# Patient Record
Sex: Male | Born: 1980 | Race: Black or African American | Hispanic: No | Marital: Single
Health system: Midwestern US, Community
[De-identification: ages and names within clinical notes are randomized; demographics above are authoritative.]

## PROBLEM LIST (undated history)

## (undated) DIAGNOSIS — E119 Type 2 diabetes mellitus without complications: Secondary | ICD-10-CM

## (undated) DIAGNOSIS — I10 Essential (primary) hypertension: Secondary | ICD-10-CM

## (undated) HISTORY — PX: TONSILLECTOMY: SUR1361

---

## 2016-03-07 ENCOUNTER — Inpatient Hospital Stay (HOSPITAL_COMMUNITY)
Admission: EM | Admit: 2016-03-07 | Discharge: 2016-03-10 | DRG: 501 | Disposition: A | Discharge status: Home Health | Payer: Worker's Compensation | Attending: Family Medicine | Admitting: Family Medicine

## 2016-03-07 ENCOUNTER — Emergency Department (HOSPITAL_COMMUNITY): Payer: Worker's Compensation

## 2016-03-07 ENCOUNTER — Encounter (HOSPITAL_COMMUNITY): Payer: Self-pay

## 2016-03-07 DIAGNOSIS — S76119A Strain of unspecified quadriceps muscle, fascia and tendon, initial encounter: Secondary | ICD-10-CM

## 2016-03-07 DIAGNOSIS — E119 Type 2 diabetes mellitus without complications: Secondary | ICD-10-CM

## 2016-03-07 DIAGNOSIS — M25569 Pain in unspecified knee: Secondary | ICD-10-CM | POA: Diagnosis present

## 2016-03-07 DIAGNOSIS — Y9269 Other specified industrial and construction area as the place of occurrence of the external cause: Secondary | ICD-10-CM

## 2016-03-07 DIAGNOSIS — W19XXXA Unspecified fall, initial encounter: Secondary | ICD-10-CM | POA: Diagnosis present

## 2016-03-07 DIAGNOSIS — Z79899 Other long term (current) drug therapy: Secondary | ICD-10-CM

## 2016-03-07 DIAGNOSIS — X509XXA Other and unspecified overexertion or strenuous movements or postures, initial encounter: Secondary | ICD-10-CM

## 2016-03-07 DIAGNOSIS — S76112A Strain of left quadriceps muscle, fascia and tendon, initial encounter: Secondary | ICD-10-CM

## 2016-03-07 DIAGNOSIS — S76111A Strain of right quadriceps muscle, fascia and tendon, initial encounter: Principal | ICD-10-CM | POA: Diagnosis present

## 2016-03-07 DIAGNOSIS — Z7984 Long term (current) use of oral hypoglycemic drugs: Secondary | ICD-10-CM

## 2016-03-07 DIAGNOSIS — M25561 Pain in right knee: Secondary | ICD-10-CM | POA: Diagnosis not present

## 2016-03-07 DIAGNOSIS — I10 Essential (primary) hypertension: Secondary | ICD-10-CM | POA: Diagnosis present

## 2016-03-07 DIAGNOSIS — Z791 Long term (current) use of non-steroidal anti-inflammatories (NSAID): Secondary | ICD-10-CM

## 2016-03-07 DIAGNOSIS — Z6841 Body Mass Index (BMI) 40.0 and over, adult: Secondary | ICD-10-CM

## 2016-03-07 DIAGNOSIS — Z23 Encounter for immunization: Secondary | ICD-10-CM

## 2016-03-07 DIAGNOSIS — S8991XA Unspecified injury of right lower leg, initial encounter: Secondary | ICD-10-CM | POA: Diagnosis present

## 2016-03-07 HISTORY — DX: Type 2 diabetes mellitus without complications: E11.9

## 2016-03-07 HISTORY — DX: Essential (primary) hypertension: I10

## 2016-03-07 NOTE — Progress Notes (Addendum)
EDCM discussed patient with Mila PalmerEDRN Mike.  Was disconnected. 2345pm Discussed patient with Dr. Wende MottMcKeag.

## 2016-03-07 NOTE — ED Triage Notes (Signed)
Pt reports he injured his left knee at work this past weekend. He reports that this morning he was trying to get up and use his crutches and his right knee gave out. He is able to flex the leg but not extend. Pt is unable to ambulate at this time.

## 2016-03-07 NOTE — ED Provider Notes (Signed)
[facility]PT Provider Note   CSN: [id number] Arrival date & time: 03/07/16  1814     History   Chief Complaint Chief Complaint  Patient presents with  . Knee Pain    HPI Basem Yannuzzi is a 35 y.o. male presenting with acute right knee injury. Patient states that this morning he was getting out of bed when he fell on his right knee and hurt up with immediate pain. He is not sure but he states that he may have twisted during this fall. Since this injury is now experiencing popping and locking in his right knee. He has significant pain over the inferior pole of the patella. He denies any new swelling or ecchymoses. He endorses some weakness but states that this may be due to the pain. He is unable to ambulate due to this injury as well as a recent injury with his left knee. No numbness. No recent antibiotic use.   Of note: he states that 2 weeks ago he sustained a LEFT sided knee injury. He claims that he was evaluated in [place] by an ED physician who diagnosed him with a patellar tendon rupture. He was advised to return home to [place] to have this further evaluated. He states that no MRI or ultrasound was used for this diagnosis but he has extension weakness with extension of his left knee which is what caused his eventual collapse and fall onto his right knee this morning. Patient has been using crutches since sustaining his left-sided knee injury 2 weeks ago.   HPI  Past Medical History:  Diagnosis Date  . Diabetes mellitus without complication (HCC)   . Hypertension     There are no active problems to display for this patient.   Past Surgical History:  Procedure Laterality Date  . TONSILLECTOMY         Home Medications    Prior to Admission medications   Medication Sig Start Date End Date Taking? Authorizing Provider  Apple Cid Vn-Grn Tea-Bit Or-Cr (APPLE CIDER VINEGAR PLUS) TABS Take 1 tablet by mouth 3 (three) times daily.   Yes Historical Provider, MD   lisinopril-hydrochlorothiazide (PRINZIDE,ZESTORETIC) 20-25 MG tablet Take 1 tablet by mouth daily.   Yes Historical Provider, MD  metFORMIN (GLUCOPHAGE) 1000 MG tablet Take 1,000 mg by mouth 2 (two) times daily with a meal.   Yes Historical Provider, MD  naproxen (NAPROSYN) 500 MG tablet Take 500 mg by mouth 2 (two) times daily with a meal.   Yes Historical Provider, MD  oxyCODONE-acetaminophen (PERCOCET/ROXICET) 5-325 MG tablet Take 1 tablet by mouth every 6 (six) hours as needed for severe pain.   Yes Historical Provider, MD    Family History History reviewed. No pertinent family history.  Social History Social History  Substance Use Topics  . Smoking status: Never Smoker  . Smokeless tobacco: Never Used  . Alcohol use No     Allergies   Review of patient's allergies indicates no known allergies.   Review of Systems Review of Systems  Constitutional: Negative for chills, diaphoresis, fatigue and fever.  HENT: Negative.   Eyes: Negative.   Respiratory: Negative.   Cardiovascular: Negative.   Gastrointestinal: Negative.   Musculoskeletal: Positive for arthralgias, gait problem and myalgias. Negative for back pain, joint swelling, neck pain and neck stiffness.  Skin: Negative for color change, rash and wound.  Neurological: Negative for dizziness, tremors, seizures, syncope, facial asymmetry, speech difficulty, light-headedness, numbness and headaches.  Hematological: Negative.   Psychiatric/Behavioral: Negative.  Physical Exam Updated Vital Signs BP 123/86   Pulse 102   Temp 97.5 F (36.4 C) (Oral)   Resp 18   Ht 6' (1.829 m)   Wt (!) 190.5 kg   SpO2 98%   BMI 56.96 kg/m   Physical Exam  Constitutional: He is oriented to person, place, and time. He appears well-developed and well-nourished. No distress.  HENT:  Head: Normocephalic and atraumatic.  Mouth/Throat: Oropharynx is clear and moist.  Eyes: Conjunctivae and EOM are normal. Pupils are equal, round,  and reactive to light.  Neck: Normal range of motion. Neck supple.  Cardiovascular: Normal rate, regular rhythm and normal heart sounds.   No murmur heard. Pulmonary/Chest: Effort normal and breath sounds normal. No respiratory distress.  Abdominal: Soft. Bowel sounds are normal. He exhibits no distension. There is no tenderness.  Musculoskeletal:       Right knee: He exhibits decreased range of motion. He exhibits no swelling, no ecchymosis, no deformity and no erythema. Tenderness found. Medial joint line and patellar tendon tenderness noted.       Left knee: He exhibits decreased range of motion and abnormal patellar mobility. He exhibits no swelling, no effusion, no ecchymosis, no erythema and normal alignment. Tenderness found. Patellar tendon tenderness noted. No medial joint line and no lateral joint line tenderness noted.       Legs: Unable to fully assess integrity of MCL/LCL/ACL/PCL secondary to pain. Patient unable to elevate LEFT leg in full extension.  Neurological: He is alert and oriented to person, place, and time. No cranial nerve deficit. He exhibits normal muscle tone.  Skin: Skin is warm and dry. Capillary refill takes less than 2 seconds. No rash noted. He is not diaphoretic.  Psychiatric: He has a normal mood and affect. His behavior is normal. Judgment and thought content normal.     ED Treatments / Results  Labs (all labs ordered are listed, but only abnormal results are displayed) Labs Reviewed  BASIC METABOLIC PANEL  CBC WITH DIFFERENTIAL/PLATELET    EKG  EKG Interpretation None       Radiology Dg Knee Complete 4 Views Right  Result Date: 03/07/2016 CLINICAL DATA:  Generalized right knee pain after fall while reaching for crutches. EXAM: RIGHT KNEE - COMPLETE 4+ VIEW COMPARISON:  None. FINDINGS: Slight joint space narrowing of the femorotibial compartment with minimal spurring of the tibial spine and tibial plateau. No intra-articular loose body,  fracture or bone destruction. No significant joint effusion. No gross malalignment. Soft tissues are grossly unremarkable. IMPRESSION: Mild femorotibial joint space narrowing and spurring without acute osseous abnormality. Electronically Signed   By: Tollie Eth M.D.   On: 03/07/2016 20:09    Procedures Procedures (including critical care time)  Medications Ordered in ED Medications  oxyCODONE-acetaminophen (PERCOCET/ROXICET) 5-325 MG per tablet 1 tablet (1 tablet Oral Given 03/08/16 0019)     Initial Impression / Assessment and Plan / ED Course  I have reviewed the triage vital signs and the nursing notes.  Pertinent labs & imaging results that were available during my care of the patient were reviewed by me and considered in my medical decision making (see chart for details).  Clinical Course   Right Knee Injury: Patient is here after sustaining a right knee injury. Etiology likely right meniscal tear. No evidence of effusion however patient endorses significant new popping and locking of the knee. Patient has significant tenderness over the medial joint line and inferior pole of the patella. Due to this acute  injury patient is unable to bear weight. Unfortunately patient also recently sustained a left knee injury which was diagnosed as a patellar tendon rupture, which he exhibits significant weakness in left knee extension. Patient does not have access to a wheelchair and is currently under Worker's Compensation for the initial injury weeks ago. Patient also reports that his home is not accessible even if a wheelchair was made available. Because of this he was deemed appropriate to have patient admitted for observation. Orthopedic surgery was consulted, Dr. Luiz BlareGraves agreed to see him in the morning.   Final Clinical Impressions(s) / ED Diagnoses   Final diagnoses:  Knee injury, right, initial encounter    New Prescriptions New Prescriptions   No medications on file     Kathee DeltonIan D  McKeag, MD 03/08/16 [id number]    Alvira MondayErin Schlossman, MD 03/12/16 1550

## 2016-03-07 NOTE — Progress Notes (Signed)
[facility].  Patient does not have insurance.  EDCM was also informed that patient is also under a worker's compensation case.  EDCM spoke to Dr. Wende MottMckeag.  Patient's case worker is responsible for coordinating patient's care. Any other interference could jeopardize patient's case.  EDCM discussed patient with EDRN Cricket, EDCM was transferred into patient's room.  Patient [place] is from [place]  He reports he has been in contact with his case worker.  He reports his case worker doesn't understand why the hospital in [place] discharged him and said he was medically cleared.  EDCM explained to patient that he needs to meet certain criteria to be admitted to the hospital.  [facility] explained to patient that without insurance it would be difficult to be placed in a facility or to arrange home health services.  Patient verbalized understanding.  EDCM informed patient will be happy to have the doctor place an order in the computer for a wheelchair, however patient is unsure if it will affect his case.  Patient reports his home is not wheelchair accessible.  EDCM spoke to patient's wife on the phone.  She is concerned because she is unable to take care of him at home due to his size.  She reports the case worker from patient's worker's compensation case is working on getting the patient an appointment with an orthopedic in the area.  EDCM also encouraged patient's wife to speak to patient's case worker regarding home health services.  EDCM offered support to patient and his wife.  No further EDCM needs at this time.

## 2016-03-08 ENCOUNTER — Encounter (HOSPITAL_COMMUNITY): Payer: Self-pay | Admitting: *Deleted

## 2016-03-08 ENCOUNTER — Other Ambulatory Visit: Payer: Self-pay | Admitting: Orthopedic Surgery

## 2016-03-08 ENCOUNTER — Observation Stay (HOSPITAL_COMMUNITY): Payer: Worker's Compensation

## 2016-03-08 DIAGNOSIS — Z791 Long term (current) use of non-steroidal anti-inflammatories (NSAID): Secondary | ICD-10-CM | POA: Diagnosis not present

## 2016-03-08 DIAGNOSIS — X509XXA Other and unspecified overexertion or strenuous movements or postures, initial encounter: Secondary | ICD-10-CM | POA: Diagnosis not present

## 2016-03-08 DIAGNOSIS — E119 Type 2 diabetes mellitus without complications: Secondary | ICD-10-CM

## 2016-03-08 DIAGNOSIS — S76112A Strain of left quadriceps muscle, fascia and tendon, initial encounter: Secondary | ICD-10-CM | POA: Diagnosis present

## 2016-03-08 DIAGNOSIS — Z7984 Long term (current) use of oral hypoglycemic drugs: Secondary | ICD-10-CM | POA: Diagnosis not present

## 2016-03-08 DIAGNOSIS — S8991XA Unspecified injury of right lower leg, initial encounter: Secondary | ICD-10-CM | POA: Diagnosis not present

## 2016-03-08 DIAGNOSIS — M25569 Pain in unspecified knee: Secondary | ICD-10-CM | POA: Diagnosis present

## 2016-03-08 DIAGNOSIS — Z6841 Body Mass Index (BMI) 40.0 and over, adult: Secondary | ICD-10-CM | POA: Diagnosis not present

## 2016-03-08 DIAGNOSIS — I10 Essential (primary) hypertension: Secondary | ICD-10-CM | POA: Diagnosis present

## 2016-03-08 DIAGNOSIS — Y9269 Other specified industrial and construction area as the place of occurrence of the external cause: Secondary | ICD-10-CM | POA: Diagnosis not present

## 2016-03-08 DIAGNOSIS — S76111A Strain of right quadriceps muscle, fascia and tendon, initial encounter: Secondary | ICD-10-CM | POA: Diagnosis present

## 2016-03-08 DIAGNOSIS — W19XXXA Unspecified fall, initial encounter: Secondary | ICD-10-CM | POA: Diagnosis present

## 2016-03-08 DIAGNOSIS — Z23 Encounter for immunization: Secondary | ICD-10-CM | POA: Diagnosis not present

## 2016-03-08 DIAGNOSIS — Z79899 Other long term (current) drug therapy: Secondary | ICD-10-CM | POA: Diagnosis not present

## 2016-03-08 DIAGNOSIS — M25561 Pain in right knee: Secondary | ICD-10-CM | POA: Diagnosis present

## 2016-03-08 LAB — CBC WITH DIFFERENTIAL/PLATELET
BASOS ABS: 0 10*3/uL (ref 0.0–0.1)
BASOS PCT: 0 %
Basophils Absolute: 0 10*3/uL (ref 0.0–0.1)
Basophils Relative: 0 %
EOS ABS: 0.1 10*3/uL (ref 0.0–0.7)
EOS PCT: 1 %
Eosinophils Absolute: 0.1 10*3/uL (ref 0.0–0.7)
Eosinophils Relative: 1 %
HEMATOCRIT: 38.9 % — AB (ref 39.0–52.0)
HEMATOCRIT: 41.9 % (ref 39.0–52.0)
Hemoglobin: 14.2 g/dL (ref 13.0–17.0)
Hemoglobin: 13 g/dL (ref 13.0–17.0)
LYMPHS ABS: 2.4 10*3/uL (ref 0.7–4.0)
LYMPHS PCT: 41 %
Lymphocytes Relative: 32 %
Lymphs Abs: 2.4 10*3/uL (ref 0.7–4.0)
MCH: 29.3 pg (ref 26.0–34.0)
MCH: 29.3 pg (ref 26.0–34.0)
MCHC: 33.4 g/dL (ref 30.0–36.0)
MCHC: 33.9 g/dL (ref 30.0–36.0)
MCV: 86.4 fL (ref 78.0–100.0)
MCV: 87.8 fL (ref 78.0–100.0)
MONO ABS: 0.4 10*3/uL (ref 0.1–1.0)
MONO ABS: 0.4 10*3/uL (ref 0.1–1.0)
MONOS PCT: 6 %
Monocytes Relative: 5 %
NEUTROS ABS: 3.1 10*3/uL (ref 1.7–7.7)
Neutro Abs: 4.8 10*3/uL (ref 1.7–7.7)
Neutrophils Relative %: 62 %
Neutrophils Relative %: 52 %
PLATELETS: 248 10*3/uL (ref 150–400)
Platelets: 218 10*3/uL (ref 150–400)
RBC: 4.85 MIL/uL (ref 4.22–5.81)
RBC: 4.43 MIL/uL (ref 4.22–5.81)
RDW: 11.7 % (ref 11.5–15.5)
RDW: 11.9 % (ref 11.5–15.5)
WBC: 7.7 10*3/uL (ref 4.0–10.5)
WBC: 5.9 10*3/uL (ref 4.0–10.5)

## 2016-03-08 LAB — GLUCOSE, CAPILLARY
GLUCOSE-CAPILLARY: 148 mg/dL — AB (ref 65–99)
GLUCOSE-CAPILLARY: 99 mg/dL (ref 65–99)
Glucose-Capillary: 118 mg/dL — ABNORMAL HIGH (ref 65–99)
Glucose-Capillary: 143 mg/dL — ABNORMAL HIGH (ref 65–99)

## 2016-03-08 LAB — COMPREHENSIVE METABOLIC PANEL
ALBUMIN: 3.6 g/dL (ref 3.5–5.0)
ALK PHOS: 55 U/L (ref 38–126)
ALT: 35 U/L (ref 17–63)
ANION GAP: 9 (ref 5–15)
AST: 22 U/L (ref 15–41)
BUN: 18 mg/dL (ref 6–20)
CO2: 26 mmol/L (ref 22–32)
Calcium: 9 mg/dL (ref 8.9–10.3)
Chloride: 99 mmol/L — ABNORMAL LOW (ref 101–111)
Creatinine, Ser: 1.51 mg/dL — ABNORMAL HIGH (ref 0.61–1.24)
GFR calc Af Amer: >60 mL/min (ref >60)
GFR calc non Af Amer: 58 mL/min — ABNORMAL LOW (ref >60)
GLUCOSE: 125 mg/dL — AB (ref 65–99)
POTASSIUM: 3.9 mmol/L (ref 3.5–5.1)
SODIUM: 134 mmol/L — AB (ref 135–145)
Total Bilirubin: 1.1 mg/dL (ref 0.3–1.2)
Total Protein: 6.5 g/dL (ref 6.5–8.1)

## 2016-03-08 LAB — BASIC METABOLIC PANEL
Anion gap: 10 (ref 5–15)
BUN: 17 mg/dL (ref 6–20)
CALCIUM: 9.3 mg/dL (ref 8.9–10.3)
CO2: 25 mmol/L (ref 22–32)
CREATININE: 1.37 mg/dL — AB (ref 0.61–1.24)
Chloride: 100 mmol/L — ABNORMAL LOW (ref 101–111)
GFR calc Af Amer: >60 mL/min (ref >60)
GLUCOSE: 148 mg/dL — AB (ref 65–99)
Potassium: 4 mmol/L (ref 3.5–5.1)
Sodium: 135 mmol/L (ref 135–145)

## 2016-03-08 MED ORDER — HYDROCHLOROTHIAZIDE 25 MG PO TABS
25.0000 mg | ORAL_TABLET | Freq: Every day | ORAL | Status: DC
  Administered 2016-03-08 – 2016-03-09 (×2): 25 mg via ORAL
  Filled 2016-03-08 (×2): qty 1

## 2016-03-08 MED ORDER — ONDANSETRON HCL 4 MG/2ML IJ SOLN
4.0000 mg | Freq: Four times a day (QID) | INTRAMUSCULAR | Status: DC | PRN
Start: 2016-03-08 — End: 2016-03-10

## 2016-03-08 MED ORDER — POVIDONE-IODINE 10 % EX SWAB: 2.0000 "application " | Freq: Once | CUTANEOUS | Status: DC

## 2016-03-08 MED ORDER — INFLUENZA VAC SPLIT QUAD 0.5 ML IM SUSY
0.5000 mL | PREFILLED_SYRINGE | INTRAMUSCULAR | Status: AC
  Administered 2016-03-10: 0.5 mL via INTRAMUSCULAR
  Filled 2016-03-08: qty 0.5

## 2016-03-08 MED ORDER — ACETAMINOPHEN 325 MG PO TABS: 650.0000 mg | ORAL_TABLET | Freq: Four times a day (QID) | ORAL | Status: DC | PRN

## 2016-03-08 MED ORDER — OXYCODONE-ACETAMINOPHEN 5-325 MG PO TABS
1.0000 | ORAL_TABLET | Freq: Once | ORAL | Status: AC
Start: 2016-03-08 — End: 2016-03-08
  Administered 2016-03-08: 1 via ORAL
  Filled 2016-03-08: qty 1

## 2016-03-08 MED ORDER — LISINOPRIL 20 MG PO TABS
20.0000 mg | ORAL_TABLET | Freq: Every day | ORAL | Status: DC
  Administered 2016-03-08 – 2016-03-09 (×2): 20 mg via ORAL
  Filled 2016-03-08 (×2): qty 1

## 2016-03-08 MED ORDER — OXYCODONE-ACETAMINOPHEN 5-325 MG PO TABS
1.0000 | ORAL_TABLET | ORAL | Status: DC | PRN
  Administered 2016-03-08: 1 via ORAL
  Filled 2016-03-08: qty 1

## 2016-03-08 MED ORDER — CHLORHEXIDINE GLUCONATE 4 % EX LIQD
60.0000 mL | Freq: Once | CUTANEOUS | Status: AC
  Administered 2016-03-09: 4 via TOPICAL
  Filled 2016-03-08: qty 60

## 2016-03-08 MED ORDER — DEXTROSE 5 % IV SOLN
3.0000 g | INTRAVENOUS | Status: AC
  Administered 2016-03-09: 3 g via INTRAVENOUS
  Filled 2016-03-08 (×2): qty 3000

## 2016-03-08 MED ORDER — INSULIN ASPART 100 UNIT/ML ~~LOC~~ SOLN
0.0000 [IU] | Freq: Three times a day (TID) | SUBCUTANEOUS | Status: DC
  Administered 2016-03-10: 1 [IU] via SUBCUTANEOUS

## 2016-03-08 MED ORDER — KETOROLAC TROMETHAMINE 30 MG/ML IJ SOLN
30.0000 mg | Freq: Once | INTRAMUSCULAR | Status: AC
  Administered 2016-03-08: 30 mg via INTRAVENOUS
  Filled 2016-03-08: qty 1

## 2016-03-08 MED ORDER — ACETAMINOPHEN 650 MG RE SUPP: 650.0000 mg | Freq: Four times a day (QID) | RECTAL | Status: DC | PRN

## 2016-03-08 MED ORDER — ONDANSETRON HCL 4 MG PO TABS: 4.0000 mg | ORAL_TABLET | Freq: Four times a day (QID) | ORAL | Status: DC | PRN

## 2016-03-08 MED ORDER — LISINOPRIL-HYDROCHLOROTHIAZIDE 20-25 MG PO TABS: 1.0000 | ORAL_TABLET | Freq: Every day | ORAL | Status: DC

## 2016-03-08 NOTE — Evaluation (Signed)
Physical Therapy Evaluation Patient Details Name: Frank Olsen MRN: [id number] DOB: [birth year] Today's Date: 03/08/2016   History of Present Illness  Frank Olsen is a 35 y.o. male with diabetes mellitus type 2 and hypertension presents to the ER with right knee pain. Patient had sustained injury to his left knee 2 weeks ago. And at that time was told patient probably had a patellar tendon rupture. Yesterday while trying to get out of the bed and go to the bathroom he twisted his right knee and started developing pain. Patient has difficulty completely extending his both knees. X-rays do not show anything acute in the right knee.  Clinical Impression   Pt admitted with above diagnosis. Pt currently with functional limitations due to the deficits listed below (see PT Problem List). Plan for MRI bil knees and likely surgery tomorrow; Overall moving very well in KIs; Anticipate good progress;  Pt will benefit from skilled PT to increase their independence and safety with mobility to allow discharge to the venue listed below.       Follow Up Recommendations Home health PT;Supervision - Intermittent    Equipment Recommendations  Rolling walker with 5" wheels;3in1 (PT)    Recommendations for Other Services OT consult     Precautions / Restrictions Precautions Precautions: Fall Required Braces or Orthoses: Knee Immobilizer - Right;Knee Immobilizer - Left Restrictions Weight Bearing Restrictions: Yes RLE Weight Bearing: Weight bearing as tolerated LLE Weight Bearing: Weight bearing as tolerated      Mobility  Bed Mobility Overal bed mobility: Needs Assistance Bed Mobility: Supine to Sit     Supine to sit: Min guard     General bed mobility comments: Using rails, and inefficient movement, but able to get up with cues for technqiue and no need for physical assist  Transfers Overall transfer level: Needs assistance Equipment used: Rolling walker (2 wheeled) Transfers: Sit  to/from Stand Sit to Stand: Mod assist;Min assist         General transfer comment: Light mod assist to steady and power up from low bed; verbal and demo cues for technique; min assist to rise from recliner with seat height elevated and use of armrests  Ambulation/Gait Ambulation/Gait assistance: Min guard Ambulation Distance (Feet):  (Pivotal steps bed to chair) Assistive device: Rolling walker (2 wheeled) Gait Pattern/deviations: Shuffle     General Gait Details: Good use of RW for support; small steps bed to chair; no increased pain; was pleasantly surprised with his abiilty to take steps  Stairs            Wheelchair Mobility    Modified Rankin (Stroke Patients Only)       Balance                                             Pertinent Vitals/Pain Pain Assessment: 0-10 Pain Score: 2  Pain Location: Bilateral knees; Indicates that he feel smuch more comfortable and secure with KIs Pain Descriptors / Indicators: Aching Pain Intervention(s): Monitored during session    Home Living Family/patient expects to be discharged to:: Private residence Living Arrangements: Spouse/significant other (and 719 month old) Available Help at Discharge: Family;Available PRN/intermittently;Friend(s) Type of Home: House Home Access: Stairs to enter   Entergy CorporationEntrance Stairs-Number of Steps: 2 Home Layout: One level Home Equipment: Crutches      Prior Function Level of Independence: Independent  Comments: drives trucks for [employer]        Extremity/Trunk Assessment   Upper Extremity Assessment: Overall WFL for tasks assessed           Lower Extremity Assessment: RLE deficits/detail;LLE deficits/detail RLE Deficits / Details: Hip ROM grossly limited by body habitus, but able to fully extend hips in standing; knees immobilized in extension LLE Deficits / Details: Hip ROM grossly limited by body habitus, but able to sully  extend hips in standing; knees immobilized in extension     Communication   Communication: No difficulties  Cognition Arousal/Alertness: Awake/alert Behavior During Therapy: WFL for tasks assessed/performed Overall Cognitive Status: Within Functional Limits for tasks assessed                      General Comments General comments (skin integrity, edema, etc.): Educated in the need for KIs for ANY and ALL standing activity    Exercises     Assessment/Plan    PT Assessment Patient needs continued PT services  PT Problem List Decreased range of motion;Decreased activity tolerance;Decreased balance;Decreased mobility;Decreased knowledge of use of DME;Decreased knowledge of precautions;Pain;Obesity          PT Treatment Interventions DME instruction;Gait training;Stair training;Functional mobility training;Therapeutic activities;Therapeutic exercise;Patient/family education    PT Goals (Current goals can be found in the Care Plan section)  Acute Rehab PT Goals Patient Stated Goal: Hopes surgery goes well PT Goal Formulation: With patient Time For Goal Achievement: 03/15/16 Potential to Achieve Goals: Good    Frequency Min 6X/week   Barriers to discharge        Co-evaluation               End of Session Equipment Utilized During Treatment: Gait belt;Right knee immobilizer;Left knee immobilizer Activity Tolerance: Patient tolerated treatment well Patient left: in chair;with call bell/phone within reach Nurse Communication: Mobility status    Functional Assessment Tool Used: Clinical Judgement Functional Limitation: Mobility: Walking and moving around Mobility: Walking and Moving Around Current Status (U9811): At least 1 percent but less than 20 percent impaired, limited or restricted Mobility: Walking and Moving Around Goal Status [phone]): 0 percent impaired, limited or restricted    Time: 2956-2130 PT Time Calculation (min) (ACUTE ONLY): 34  min   Charges:   PT Evaluation $PT Eval Moderate Complexity: 1 Procedure PT Treatments $Therapeutic Activity: 8-22 mins   PT G Codes:   PT G-Codes **NOT FOR INPATIENT CLASS** Functional Assessment Tool Used: Clinical Judgement Functional Limitation: Mobility: Walking and moving around Mobility: Walking and Moving Around Current Status (Q6578): At least 1 percent but less than 20 percent impaired, limited or restricted Mobility: Walking and Moving Around Goal Status [phone]): 0 percent impaired, limited or restricted    Van Clines Hamff 03/08/2016, 1:41 PM  Van Clines, PT  Acute Rehabilitation Services Pager [phone] Office [phone]

## 2016-03-08 NOTE — Consult Note (Signed)
Reason for Consult:Right and left leg injuries Referring Physician: Internal medicine.  Frank Olsen is an 35 y.o. male.  HPI: the patient is a 68 year oldobese malewho suffered a left knee injury2 weeks ago.  He is in the process of getting this worked up in the left knee gave out on him and he suffered a right knee injury. He went to the [facility] emergency room where minimal treatment was given. He came to [place] for further evaluation and was admitted to the medical service for our consultation. He states that he's having difficult time standing and concerns for the legs giving out. He denies previous history of injury to his knees. He states this is a workers comp injury.  Past Medical History:  Diagnosis Date  . Diabetes mellitus without complication ([place])   . Hypertension     Past Surgical History:  Procedure Laterality Date  . TONSILLECTOMY      Family History  Problem Relation Age of Onset  . Diabetes Mellitus II Mother   . Diabetes Mellitus II Father     Social History:  reports that he has never smoked. He has never used smokeless tobacco. He reports that he does not drink alcohol or use drugs.  Allergies: No Known Allergies  Medications: I have reviewed the patient's current medications.  Results for orders placed or performed during the hospital encounter of 03/07/16 (from the past 48 hour(s))  Basic metabolic panel     Status: Abnormal   Collection Time: 03/08/16 12:39 AM  Result Value Ref Range   Sodium 135 135 - 145 mmol/L   Potassium 4.0 3.5 - 5.1 mmol/L   Chloride 100 (L) 101 - 111 mmol/L   CO2 25 22 - 32 mmol/L   Glucose, Bld 148 (H) 65 - 99 mg/dL   BUN 17 6 - 20 mg/dL   Creatinine, Ser 1.37 (H) 0.61 - 1.24 mg/dL   Calcium 9.3 8.9 - 10.3 mg/dL   GFR calc non Af Amer >60 >60 mL/min   GFR calc Af Amer >60 >60 mL/min    Comment: (NOTE) The eGFR has been calculated using the CKD EPI equation. This calculation has not been validated in all clinical  situations. eGFR's persistently <60 mL/min signify possible Chronic Kidney Disease.    Anion gap 10 5 - 15  CBC with Differential     Status: None   Collection Time: 03/08/16 12:39 AM  Result Value Ref Range   WBC 7.7 4.0 - 10.5 K/uL   RBC 4.85 4.22 - 5.81 MIL/uL   Hemoglobin 14.2 13.0 - 17.0 g/dL   HCT 41.9 39.0 - 52.0 %   MCV 86.4 78.0 - 100.0 fL   MCH 29.3 26.0 - 34.0 pg   MCHC 33.9 30.0 - 36.0 g/dL   RDW 11.7 11.5 - 15.5 %   Platelets 248 150 - 400 K/uL   Neutrophils Relative % 62 %   Neutro Abs 4.8 1.7 - 7.7 K/uL   Lymphocytes Relative 32 %   Lymphs Abs 2.4 0.7 - 4.0 K/uL   Monocytes Relative 5 %   Monocytes Absolute 0.4 0.1 - 1.0 K/uL   Eosinophils Relative 1 %   Eosinophils Absolute 0.1 0.0 - 0.7 K/uL   Basophils Relative 0 %   Basophils Absolute 0.0 0.0 - 0.1 K/uL  Glucose, capillary     Status: Abnormal   Collection Time: 03/08/16  3:45 AM  Result Value Ref Range   Glucose-Capillary 148 (H) 65 - 99 mg/dL  Glucose, capillary  Status: Abnormal   Collection Time: 03/08/16  7:45 AM  Result Value Ref Range   Glucose-Capillary 118 (H) 65 - 99 mg/dL    Dg Knee Complete 4 Views Right  Result Date: 03/07/2016 CLINICAL DATA:  Generalized right knee pain after fall while reaching for crutches. EXAM: RIGHT KNEE - COMPLETE 4+ VIEW COMPARISON:  None. FINDINGS: Slight joint space narrowing of the femorotibial compartment with minimal spurring of the tibial spine and tibial plateau. No intra-articular loose body, fracture or bone destruction. No significant joint effusion. No gross malalignment. Soft tissues are grossly unremarkable. IMPRESSION: Mild femorotibial joint space narrowing and spurring without acute osseous abnormality. Electronically Signed   By: Ashley Royalty M.D.   On: 03/07/2016 20:09    ROS  ROS: I have reviewed the patient's review of systems thoroughly and there are no positive responses as relates to the HPI. Blood pressure 131/86, pulse 83, temperature 97.6  F (36.4 C), resp. rate 17, height 6' (1.829 m), weight (!) 190.5 kg (420 lb), SpO2 98 %. Physical Exam Well-developed well-nourished patient in no acute distress. Alert and oriented x3 HEENT:within normal limits Cardiac: Regular rate and rhythm Pulmonary: Lungs clear to auscultation Abdomen: Soft and nontender.  Normal active bowel sounds  Musculoskeletal: (left knee: Obvious quad tendon rupture with palpable defect superior to the patella. Right knee questionable palpable defect superior to the patella but possibly also inferior to the patella as well.   An ability to do straight leg raise or straighten his legs on either side.) Assessment/Plan: 35 year old male with work-related injury and suffering a leftquad tendon rupture. With an adequate evaluation and treatment the patient was trying to get up and his left leg gave way and he suffered a right leg injury.  He presents to emergency room for evaluation and treatment.//the patient needs MRI examination of both right and left legs. Once we have better understanding of his overall problems he will obviously need surgery on the left side and with a better understanding of his right knee pathology will likely need surgery on the right side.   We'll have better understanding of this once his MRIs are accomplished.  We appreciate medical involvement in his case.  Lamisha Roussell L 03/08/2016, 8:26 AM

## 2016-03-08 NOTE — ED Notes (Signed)
Pt refused IV at this time until medically necessary.

## 2016-03-08 NOTE — ED Notes (Signed)
Contacted by Amy from case management regarding pt's mobility at discharge. CM advised pt be admitted due to pt stated inability to safely ambulate within home.

## 2016-03-08 NOTE — Care Management Note (Signed)
Case Management Note  Patient Details  Name: Frank Olsen MRN: [id number] Date of Birth: 05/19/1980  Subjective/Objective:      CM following for progression and d/c planning.               Action/Plan: 03/08/2016 11:35am placed call to pt workers comp Sports coachcase manager, BellSouthCindy Olsen @ [phone]. Ms Frank EisenmengerFelix states that she has received notes from Orthopedist office and will follow for possible surgery. This CM provided info as needed and offered phone number for workers comp case manager to call for any ongoing needs or followup.    Expected Discharge Date:  Pending MRI              Expected Discharge Plan:  Home w Home Health Services  In-House Referral:  NA  Discharge planning Services  CM Consult  Post Acute Care Choice:  Durable Medical Equipment, Home Health Choice offered to:   (Pt workers Science writercompensation case manager, Frank PlyCindy Olsen @ [phone])  DME Arranged:    DME Agency:     HH Arranged:    HH Agency:     Status of Service:  In process, will continue to follow  If discussed at Long Length of Stay Meetings, dates discussed:    Additional Comments:  Frank Olsen, Frank Olsen U, RN 03/08/2016, 11:42 AM

## 2016-03-08 NOTE — Progress Notes (Signed)
Patient seen and examined. Admitted after midnight secondary to right knee pain and difficulty ambulation. Patient had hx of left knee injury and quad tendon rupture, waitng surgical repair for that and wearing long leg brace; who lost his balance and ended twisting his right knee and is now experiencing increase pain on the right and inability to straighten his legs. Due this patient is having increase difficulty walking and performing ADL's. Refer to HIP written by Dr. Toniann FailKakrakandy for further info/details on admission.  Plan: -ortho has been consulted -plan is for MRI of both knees later today and determine surgical needs -will control BP and diabetes  -patient with hx of obesity (Body mass index is 56.96 kg/m.), long discussion about low calorie diet, portion control and exercise sustained with patient.  Frank Olsen, Frank Olsen [phone]

## 2016-03-08 NOTE — H&P (Signed)
History and Physical    Frank Olsen WUJ:[id number] DOB: 02-15-1981 DOA: 03/07/2016  PCP: No primary care provider on file.  Patient coming from: Home.  Chief Complaint: Right knee pain.  HPI: Frank BodoLinwood Molchan is a 35 y.o. male with diabetes mellitus type 2 and hypertension presents to the ER with right knee pain. Patient had sustained injury to his left knee 2 weeks ago. And at that time was told patient probably had a patellar tendon rupture. Yesterday while trying to get out of the bed and go to the bathroom he twisted his right knee and started developing pain. Patient has difficulty completely extending his both knees. X-rays do not show anything acute in the right knee. Since patient has difficulty ambulating at this time patient has been admitted and on call orthopedic surgeon Dr. Luiz BlareGraves will be seeing patient in consult.   ED Course: X-rays do not show anything acute.  Review of Systems: As per HPI, rest all negative.   Past Medical History:  Diagnosis Date  . Diabetes mellitus without complication (HCC)   . Hypertension     Past Surgical History:  Procedure Laterality Date  . TONSILLECTOMY       reports that he has never smoked. He has never used smokeless tobacco. He reports that he does not drink alcohol or use drugs.  No Known Allergies  Family History  Problem Relation Age of Onset  . Diabetes Mellitus II Mother   . Diabetes Mellitus II Father     Prior to Admission medications   Medication Sig Start Date End Date Taking? Authorizing Provider  Apple Cid Vn-Grn Tea-Bit Or-Cr (APPLE CIDER VINEGAR PLUS) TABS Take 1 tablet by mouth 3 (three) times daily.   Yes Historical Provider, MD  lisinopril-hydrochlorothiazide (PRINZIDE,ZESTORETIC) 20-25 MG tablet Take 1 tablet by mouth daily.   Yes Historical Provider, MD  metFORMIN (GLUCOPHAGE) 1000 MG tablet Take 1,000 mg by mouth 2 (two) times daily with a meal.   Yes Historical Provider, MD  naproxen (NAPROSYN) 500 MG  tablet Take 500 mg by mouth 2 (two) times daily with a meal.   Yes Historical Provider, MD  oxyCODONE-acetaminophen (PERCOCET/ROXICET) 5-325 MG tablet Take 1 tablet by mouth every 6 (six) hours as needed for severe pain.   Yes Historical Provider, MD    Physical Exam: Vitals:   03/08/16 0230 03/08/16 0245 03/08/16 0348 03/08/16 0540  BP: 125/70 119/68 (!) 165/98 131/86  Pulse: 88 87 88 83  Resp:   18 17  Temp:   97.5 F (36.4 C) 97.6 F (36.4 C)  TempSrc:      SpO2: 94% 95% 100% 98%  Weight:      Height:          Constitutional: Obese not in distress. Vitals:   03/08/16 0230 03/08/16 0245 03/08/16 0348 03/08/16 0540  BP: 125/70 119/68 (!) 165/98 131/86  Pulse: 88 87 88 83  Resp:   18 17  Temp:   97.5 F (36.4 C) 97.6 F (36.4 C)  TempSrc:      SpO2: 94% 95% 100% 98%  Weight:      Height:       Eyes: Anicteric no pallor. ENMT: No discharge from the ears eyes nose or mouth. Neck: No mass felt. No JVD appreciated. Respiratory: No rhonchi or crepitations. Cardiovascular: S1 and S2 heard. No murmurs appreciated. Abdomen: Soft nontender bowel sounds present. Musculoskeletal: Pain on moving both knees. Skin: No rash. Skin appears warm. Neurologic: Alert awake oriented to time  place and person. Moves all extremities. Psychiatric: Appears normal. Normal affect.   Labs on Admission: I have personally reviewed following labs and imaging studies  CBC:  Recent Labs Lab 03/08/16 0039  WBC 7.7  NEUTROABS 4.8  HGB 14.2  HCT 41.9  MCV 86.4  PLT 248   Basic Metabolic Panel:  Recent Labs Lab 03/08/16 0039  NA 135  K 4.0  CL 100*  CO2 25  GLUCOSE 148*  BUN 17  CREATININE 1.37*  CALCIUM 9.3   GFR: Estimated Creatinine Clearance: 129.5 mL/min (by C-G formula based on SCr of 1.37 mg/dL (H)). Liver Function Tests: No results for input(s): AST, ALT, ALKPHOS, BILITOT, PROT, ALBUMIN in the last 168 hours. No results for input(s): LIPASE, AMYLASE in the last 168  hours. No results for input(s): AMMONIA in the last 168 hours. Coagulation Profile: No results for input(s): INR, PROTIME in the last 168 hours. Cardiac Enzymes: No results for input(s): CKTOTAL, CKMB, CKMBINDEX, TROPONINI in the last 168 hours. BNP (last 3 results) No results for input(s): PROBNP in the last 8760 hours. HbA1C: No results for input(s): HGBA1C in the last 72 hours. CBG:  Recent Labs Lab 03/08/16 0345  GLUCAP 148*   Lipid Profile: No results for input(s): CHOL, HDL, LDLCALC, TRIG, CHOLHDL, LDLDIRECT in the last 72 hours. Thyroid Function Tests: No results for input(s): TSH, T4TOTAL, FREET4, T3FREE, THYROIDAB in the last 72 hours. Anemia Panel: No results for input(s): VITAMINB12, FOLATE, FERRITIN, TIBC, IRON, RETICCTPCT in the last 72 hours. Urine analysis: No results found for: COLORURINE, APPEARANCEUR, LABSPEC, PHURINE, GLUCOSEU, HGBUR, BILIRUBINUR, KETONESUR, PROTEINUR, UROBILINOGEN, NITRITE, LEUKOCYTESUR Sepsis Labs: @LABRCNTIP (procalcitonin:4,lacticidven:4) )No results found for this or any previous visit (from the past 240 hour(s)).   Radiological Exams on Admission: Dg Knee Complete 4 Views Right  Result Date: 03/07/2016 CLINICAL DATA:  Generalized right knee pain after fall while reaching for crutches. EXAM: RIGHT KNEE - COMPLETE 4+ VIEW COMPARISON:  None. FINDINGS: Slight joint space narrowing of the femorotibial compartment with minimal spurring of the tibial spine and tibial plateau. No intra-articular loose body, fracture or bone destruction. No significant joint effusion. No gross malalignment. Soft tissues are grossly unremarkable. IMPRESSION: Mild femorotibial joint space narrowing and spurring without acute osseous abnormality. Electronically Signed   By: Tollie Eth M.D.   On: 03/07/2016 20:09    Assessment/Plan Principal Problem:   Knee injury, right, initial encounter Active Problems:   Diabetes mellitus type 2, controlled (HCC)   Essential  hypertension   Knee pain    1. Bilateral knee pain - Dr. Luiz Blare of orthopedic surgery to further evaluate the patient. Patient eventually may need rehabilitation. 2. Diabetes mellitus type 2 - while inpatient and we will hold metformin and keep patient on sliding scale coverage. Closely follow CBGs. 3. Hypertension - continue lisinopril and hydrochlorothiazide. If surgery is planned then may have to hold ACE inhibitor.   DVT prophylaxis: SCDs for now. Code Status: Full code.  Family Communication: Discussed with patient.  Disposition Plan: To be determined.  Consults called: Orthopedic surgery by ER physician.  Admission status: Observation.    Eduard Clos MD [facility] Pager [phone].  If 7PM-7AM, please contact night-coverage [email or web address] Password [email or web address]  03/08/2016, 6:11 AM

## 2016-03-09 ENCOUNTER — Encounter (HOSPITAL_COMMUNITY)
Admission: EM | Disposition: A | Discharge status: Home Health | Payer: Self-pay | Source: Home / Self Care | Attending: Family Medicine

## 2016-03-09 ENCOUNTER — Encounter (HOSPITAL_COMMUNITY): Payer: Self-pay | Admitting: Surgery

## 2016-03-09 ENCOUNTER — Inpatient Hospital Stay (HOSPITAL_COMMUNITY): Payer: Worker's Compensation | Admitting: Anesthesiology

## 2016-03-09 DIAGNOSIS — S76111A Strain of right quadriceps muscle, fascia and tendon, initial encounter: Secondary | ICD-10-CM | POA: Diagnosis present

## 2016-03-09 DIAGNOSIS — S76112A Strain of left quadriceps muscle, fascia and tendon, initial encounter: Secondary | ICD-10-CM

## 2016-03-09 HISTORY — PX: QUADRICEPS TENDON REPAIR: SHX756

## 2016-03-09 LAB — BASIC METABOLIC PANEL
ANION GAP: 9 (ref 5–15)
BUN: 17 mg/dL (ref 6–20)
CO2: 28 mmol/L (ref 22–32)
Calcium: 9.1 mg/dL (ref 8.9–10.3)
Chloride: 99 mmol/L — ABNORMAL LOW (ref 101–111)
Creatinine, Ser: 1.43 mg/dL — ABNORMAL HIGH (ref 0.61–1.24)
GFR calc Af Amer: >60 mL/min (ref >60)
GLUCOSE: 130 mg/dL — AB (ref 65–99)
POTASSIUM: 3.9 mmol/L (ref 3.5–5.1)
Sodium: 136 mmol/L (ref 135–145)

## 2016-03-09 LAB — CBC
HEMATOCRIT: 40.8 % (ref 39.0–52.0)
HEMOGLOBIN: 13.5 g/dL (ref 13.0–17.0)
MCH: 28.9 pg (ref 26.0–34.0)
MCHC: 33.1 g/dL (ref 30.0–36.0)
MCV: 87.4 fL (ref 78.0–100.0)
Platelets: 219 10*3/uL (ref 150–400)
RBC: 4.67 MIL/uL (ref 4.22–5.81)
RDW: 11.9 % (ref 11.5–15.5)
WBC: 5.1 10*3/uL (ref 4.0–10.5)

## 2016-03-09 LAB — GLUCOSE, CAPILLARY
GLUCOSE-CAPILLARY: 138 mg/dL — AB (ref 65–99)
GLUCOSE-CAPILLARY: 120 mg/dL — AB (ref 65–99)
Glucose-Capillary: 110 mg/dL — ABNORMAL HIGH (ref 65–99)
Glucose-Capillary: 167 mg/dL — ABNORMAL HIGH (ref 65–99)
Glucose-Capillary: 201 mg/dL — ABNORMAL HIGH (ref 65–99)

## 2016-03-09 LAB — SURGICAL PCR SCREEN
MRSA, PCR: NEGATIVE
Staphylococcus aureus: NEGATIVE

## 2016-03-09 SURGERY — REPAIR, TENDON, QUADRICEPS
Anesthesia: General | Laterality: Bilateral

## 2016-03-09 MED ORDER — DOCUSATE SODIUM 100 MG PO CAPS
100.0000 mg | ORAL_CAPSULE | Freq: Two times a day (BID) | ORAL | Status: DC
  Administered 2016-03-09 – 2016-03-10 (×2): 100 mg via ORAL
  Filled 2016-03-09 (×2): qty 1

## 2016-03-09 MED ORDER — MEPERIDINE HCL 25 MG/ML IJ SOLN: 6.2500 mg | INTRAMUSCULAR | Status: DC | PRN

## 2016-03-09 MED ORDER — METHOCARBAMOL 500 MG PO TABS: 500.0000 mg | ORAL_TABLET | Freq: Four times a day (QID) | ORAL | Status: DC | PRN

## 2016-03-09 MED ORDER — LIDOCAINE HCL (CARDIAC) 20 MG/ML IV SOLN
INTRAVENOUS | Status: DC | PRN
  Administered 2016-03-09: 100 mg via INTRAVENOUS

## 2016-03-09 MED ORDER — EPHEDRINE 5 MG/ML INJ
INTRAVENOUS | Status: AC
  Filled 2016-03-09: qty 10

## 2016-03-09 MED ORDER — ONDANSETRON HCL 4 MG/2ML IJ SOLN
INTRAMUSCULAR | Status: DC | PRN
  Administered 2016-03-09: 4 mg via INTRAVENOUS

## 2016-03-09 MED ORDER — ONDANSETRON HCL 4 MG PO TABS: 4.0000 mg | ORAL_TABLET | Freq: Four times a day (QID) | ORAL | Status: DC | PRN

## 2016-03-09 MED ORDER — PHENYLEPHRINE HCL 10 MG/ML IJ SOLN
INTRAMUSCULAR | Status: DC | PRN
  Administered 2016-03-09: 80 ug via INTRAVENOUS
  Administered 2016-03-09 (×2): 120 ug via INTRAVENOUS
  Administered 2016-03-09: 80 ug via INTRAVENOUS

## 2016-03-09 MED ORDER — LACTATED RINGERS IV SOLN
INTRAVENOUS | Status: DC
  Administered 2016-03-09 (×2): via INTRAVENOUS

## 2016-03-09 MED ORDER — LACTATED RINGERS IV SOLN: INTRAVENOUS | Status: DC

## 2016-03-09 MED ORDER — SUCCINYLCHOLINE CHLORIDE 20 MG/ML IJ SOLN
INTRAMUSCULAR | Status: DC | PRN
Start: 2016-03-09 — End: 2016-03-09
  Administered 2016-03-09: 180 mg via INTRAVENOUS

## 2016-03-09 MED ORDER — HYDROMORPHONE HCL 1 MG/ML IJ SOLN
1.0000 mg | INTRAMUSCULAR | Status: DC | PRN
  Administered 2016-03-10: 1 mg via INTRAVENOUS
  Filled 2016-03-09: qty 1

## 2016-03-09 MED ORDER — ASPIRIN EC 325 MG PO TBEC: 325.0000 mg | DELAYED_RELEASE_TABLET | Freq: Two times a day (BID) | ORAL | 0 refills | Status: AC

## 2016-03-09 MED ORDER — EPHEDRINE SULFATE 50 MG/ML IJ SOLN
INTRAMUSCULAR | Status: DC | PRN
  Administered 2016-03-09 (×3): 10 mg via INTRAVENOUS

## 2016-03-09 MED ORDER — FENTANYL CITRATE (PF) 100 MCG/2ML IJ SOLN
25.0000 ug | INTRAMUSCULAR | Status: DC | PRN
  Administered 2016-03-09 (×3): 50 ug via INTRAVENOUS

## 2016-03-09 MED ORDER — LIDOCAINE 2% (20 MG/ML) 5 ML SYRINGE
INTRAMUSCULAR | Status: AC
  Filled 2016-03-09: qty 5

## 2016-03-09 MED ORDER — HYDROCHLOROTHIAZIDE 25 MG PO TABS
25.0000 mg | ORAL_TABLET | Freq: Every day | ORAL | Status: DC
  Administered 2016-03-10: 25 mg via ORAL
  Filled 2016-03-09: qty 1

## 2016-03-09 MED ORDER — 0.9 % SODIUM CHLORIDE (POUR BTL) OPTIME
TOPICAL | Status: DC | PRN
  Administered 2016-03-09 (×2): 1000 mL

## 2016-03-09 MED ORDER — HYDROMORPHONE HCL 2 MG/ML IJ SOLN
INTRAMUSCULAR | Status: AC
  Administered 2016-03-09: 2 mg
  Filled 2016-03-09: qty 1

## 2016-03-09 MED ORDER — FENTANYL CITRATE (PF) 100 MCG/2ML IJ SOLN
INTRAMUSCULAR | Status: AC
  Filled 2016-03-09: qty 2

## 2016-03-09 MED ORDER — OXYCODONE-ACETAMINOPHEN 5-325 MG PO TABS: 1.0000 | ORAL_TABLET | ORAL | 0 refills | Status: AC | PRN

## 2016-03-09 MED ORDER — OXYCODONE-ACETAMINOPHEN 5-325 MG PO TABS
1.0000 | ORAL_TABLET | ORAL | Status: DC | PRN
  Administered 2016-03-10: 2 via ORAL
  Filled 2016-03-09: qty 2

## 2016-03-09 MED ORDER — ACETAMINOPHEN 650 MG RE SUPP: 650.0000 mg | Freq: Four times a day (QID) | RECTAL | Status: DC | PRN

## 2016-03-09 MED ORDER — MIDAZOLAM HCL 2 MG/2ML IJ SOLN
INTRAMUSCULAR | Status: DC | PRN
  Administered 2016-03-09: 2 mg via INTRAVENOUS

## 2016-03-09 MED ORDER — DEXTROSE 5 % IV SOLN
500.0000 mg | Freq: Four times a day (QID) | INTRAVENOUS | Status: DC | PRN
  Filled 2016-03-09: qty 5

## 2016-03-09 MED ORDER — FENTANYL CITRATE (PF) 100 MCG/2ML IJ SOLN
INTRAMUSCULAR | Status: DC | PRN
  Administered 2016-03-09: 100 ug via INTRAVENOUS
  Administered 2016-03-09 (×4): 50 ug via INTRAVENOUS

## 2016-03-09 MED ORDER — PROPOFOL 10 MG/ML IV BOLUS
INTRAVENOUS | Status: DC | PRN
  Administered 2016-03-09 (×2): 50 mg via INTRAVENOUS
  Administered 2016-03-09: 200 mg via INTRAVENOUS

## 2016-03-09 MED ORDER — KETOROLAC TROMETHAMINE 15 MG/ML IJ SOLN
15.0000 mg | Freq: Four times a day (QID) | INTRAMUSCULAR | Status: DC
  Administered 2016-03-09 – 2016-03-10 (×3): 15 mg via INTRAVENOUS
  Filled 2016-03-09 (×3): qty 1

## 2016-03-09 MED ORDER — ACETAMINOPHEN 325 MG PO TABS: 650.0000 mg | ORAL_TABLET | Freq: Four times a day (QID) | ORAL | Status: DC | PRN

## 2016-03-09 MED ORDER — SODIUM CHLORIDE 0.9 % IV SOLN
INTRAVENOUS | Status: DC
  Administered 2016-03-09 – 2016-03-10 (×2): via INTRAVENOUS

## 2016-03-09 MED ORDER — MIDAZOLAM HCL 2 MG/2ML IJ SOLN
INTRAMUSCULAR | Status: AC
  Filled 2016-03-09: qty 2

## 2016-03-09 MED ORDER — ACETAMINOPHEN 10 MG/ML IV SOLN
1000.0000 mg | Freq: Once | INTRAVENOUS | Status: AC
  Administered 2016-03-09: 1000 mg via INTRAVENOUS

## 2016-03-09 MED ORDER — HYDROMORPHONE HCL 1 MG/ML IJ SOLN
0.5000 mg | INTRAMUSCULAR | Status: AC | PRN
  Administered 2016-03-09 (×4): 0.5 mg via INTRAVENOUS

## 2016-03-09 MED ORDER — CEFAZOLIN SODIUM-DEXTROSE 2-4 GM/100ML-% IV SOLN
2.0000 g | Freq: Four times a day (QID) | INTRAVENOUS | Status: AC
  Administered 2016-03-09 – 2016-03-10 (×3): 2 g via INTRAVENOUS
  Filled 2016-03-09 (×3): qty 100

## 2016-03-09 MED ORDER — BUPIVACAINE HCL (PF) 0.25 % IJ SOLN
INTRAMUSCULAR | Status: DC | PRN
  Administered 2016-03-09: 30 mL
  Administered 2016-03-09: 20 mL

## 2016-03-09 MED ORDER — FENTANYL CITRATE (PF) 100 MCG/2ML IJ SOLN
INTRAMUSCULAR | Status: AC
  Administered 2016-03-09: 50 ug via INTRAVENOUS
  Filled 2016-03-09: qty 2

## 2016-03-09 MED ORDER — ONDANSETRON HCL 4 MG/2ML IJ SOLN: 4.0000 mg | Freq: Four times a day (QID) | INTRAMUSCULAR | Status: DC | PRN

## 2016-03-09 MED ORDER — PROPOFOL 10 MG/ML IV BOLUS
INTRAVENOUS | Status: AC
  Filled 2016-03-09: qty 20

## 2016-03-09 MED ORDER — METOCLOPRAMIDE HCL 5 MG/ML IJ SOLN: 10.0000 mg | Freq: Once | INTRAMUSCULAR | Status: DC | PRN

## 2016-03-09 MED ORDER — ONDANSETRON HCL 4 MG/2ML IJ SOLN
INTRAMUSCULAR | Status: AC
  Filled 2016-03-09: qty 2

## 2016-03-09 MED ORDER — METHOCARBAMOL 750 MG PO TABS: 750.0000 mg | ORAL_TABLET | Freq: Three times a day (TID) | ORAL | 0 refills | Status: AC | PRN

## 2016-03-09 MED ORDER — HYDROMORPHONE HCL 2 MG/ML IJ SOLN
INTRAMUSCULAR | Status: AC
Start: 2016-03-09 — End: 2016-03-10
  Filled 2016-03-09: qty 1

## 2016-03-09 MED ORDER — METFORMIN HCL 500 MG PO TABS
1000.0000 mg | ORAL_TABLET | Freq: Two times a day (BID) | ORAL | Status: DC
  Administered 2016-03-10: 1000 mg via ORAL
  Filled 2016-03-09: qty 2

## 2016-03-09 MED ORDER — ENOXAPARIN SODIUM 100 MG/ML ~~LOC~~ SOLN
95.0000 mg | SUBCUTANEOUS | Status: DC
  Administered 2016-03-10: 95 mg via SUBCUTANEOUS
  Filled 2016-03-09: qty 1

## 2016-03-09 MED ORDER — ACETAMINOPHEN 10 MG/ML IV SOLN
INTRAVENOUS | Status: AC
  Administered 2016-03-09: 1000 mg via INTRAVENOUS
  Filled 2016-03-09: qty 100

## 2016-03-09 MED ORDER — HYDROMORPHONE HCL 1 MG/ML IJ SOLN
0.5000 mg | INTRAMUSCULAR | Status: DC | PRN
  Administered 2016-03-09 (×3): 0.5 mg via INTRAVENOUS

## 2016-03-09 MED ORDER — ROCURONIUM BROMIDE 10 MG/ML (PF) SYRINGE
PREFILLED_SYRINGE | INTRAVENOUS | Status: AC
  Filled 2016-03-09: qty 10

## 2016-03-09 MED ORDER — PHENYLEPHRINE 40 MCG/ML (10ML) SYRINGE FOR IV PUSH (FOR BLOOD PRESSURE SUPPORT)
PREFILLED_SYRINGE | INTRAVENOUS | Status: AC
  Filled 2016-03-09: qty 10

## 2016-03-09 MED ORDER — BUPIVACAINE HCL (PF) 0.25 % IJ SOLN
INTRAMUSCULAR | Status: AC
  Filled 2016-03-09: qty 60

## 2016-03-09 MED ORDER — LISINOPRIL 40 MG PO TABS
40.0000 mg | ORAL_TABLET | Freq: Every day | ORAL | Status: DC
  Administered 2016-03-10: 40 mg via ORAL
  Filled 2016-03-09: qty 1

## 2016-03-09 SURGICAL SUPPLY — 67 items
BANDAGE ACE 4X5 VEL STRL LF (GAUZE/BANDAGES/DRESSINGS) ×3 IMPLANT
BANDAGE ACE 6X5 VEL STRL LF (GAUZE/BANDAGES/DRESSINGS) ×3 IMPLANT
BANDAGE ESMARK 6X9 LF (GAUZE/BANDAGES/DRESSINGS) ×1 IMPLANT
BIT DRILL 7/64X5 DISP (BIT) ×3 IMPLANT
BNDG COHESIVE 4X5 TAN STRL (GAUZE/BANDAGES/DRESSINGS) ×3 IMPLANT
BNDG ELASTIC 6X15 VLCR STRL LF (GAUZE/BANDAGES/DRESSINGS) ×6 IMPLANT
BNDG ESMARK 6X9 LF (GAUZE/BANDAGES/DRESSINGS) ×3
BNDG GAUZE ELAST 4 BULKY (GAUZE/BANDAGES/DRESSINGS) ×12 IMPLANT
COVER MAYO STAND STRL (DRAPES) ×3 IMPLANT
CUFF TOURNIQUET SINGLE 34IN LL (TOURNIQUET CUFF) IMPLANT
CUFF TOURNIQUET SINGLE 44IN (TOURNIQUET CUFF) ×6 IMPLANT
DRAPE INCISE IOBAN 66X45 STRL (DRAPES) ×3 IMPLANT
DRAPE U-SHAPE 47X51 STRL (DRAPES) ×6 IMPLANT
DRSG PAD ABDOMINAL 8X10 ST (GAUZE/BANDAGES/DRESSINGS) ×3 IMPLANT
DURAPREP 26ML APPLICATOR (WOUND CARE) ×3 IMPLANT
ELECT REM PT RETURN 9FT ADLT (ELECTROSURGICAL) ×3
ELECTRODE REM PT RTRN 9FT ADLT (ELECTROSURGICAL) ×1 IMPLANT
GAUZE SPONGE 4X4 12PLY STRL (GAUZE/BANDAGES/DRESSINGS) ×6 IMPLANT
GAUZE XEROFORM 1X8 LF (GAUZE/BANDAGES/DRESSINGS) ×3 IMPLANT
GAUZE XEROFORM 5X9 LF (GAUZE/BANDAGES/DRESSINGS) ×6 IMPLANT
GLOVE BIO SURGEON STRL SZ7.5 (GLOVE) ×3 IMPLANT
GLOVE BIO SURGEON STRL SZ8.5 (GLOVE) ×3 IMPLANT
GLOVE BIOGEL PI IND STRL 8 (GLOVE) ×1 IMPLANT
GLOVE BIOGEL PI IND STRL 9 (GLOVE) ×1 IMPLANT
GLOVE BIOGEL PI INDICATOR 8 (GLOVE) ×2
GLOVE BIOGEL PI INDICATOR 9 (GLOVE) ×2
GOWN EXTRA PROTECTION XXL 0583 (GOWNS) IMPLANT
GOWN STRL REUS W/ TWL LRG LVL3 (GOWN DISPOSABLE) ×2 IMPLANT
GOWN STRL REUS W/ TWL XL LVL3 (GOWN DISPOSABLE) ×2 IMPLANT
GOWN STRL REUS W/TWL LRG LVL3 (GOWN DISPOSABLE) ×4
GOWN STRL REUS W/TWL XL LVL3 (GOWN DISPOSABLE) ×4
IMMOBILIZER KNEE 22 UNIV (SOFTGOODS) ×6 IMPLANT
KIT BASIN OR (CUSTOM PROCEDURE TRAY) ×3 IMPLANT
KIT ROOM TURNOVER OR (KITS) ×3 IMPLANT
MANIFOLD NEPTUNE II (INSTRUMENTS) ×3 IMPLANT
NEEDLE 22X1 1/2 (OR ONLY) (NEEDLE) IMPLANT
NS IRRIG 1000ML POUR BTL (IV SOLUTION) ×3 IMPLANT
PACK ORTHO EXTREMITY (CUSTOM PROCEDURE TRAY) ×3 IMPLANT
PAD ABD 8X10 STRL (GAUZE/BANDAGES/DRESSINGS) ×6 IMPLANT
PAD ARMBOARD 7.5X6 YLW CONV (MISCELLANEOUS) ×6 IMPLANT
PAD CAST 4YDX4 CTTN HI CHSV (CAST SUPPLIES) ×2 IMPLANT
PADDING CAST COTTON 4X4 STRL (CAST SUPPLIES) ×4
PASSER SUT SWANSON 36MM LOOP (INSTRUMENTS) ×9 IMPLANT
SPONGE LAP 18X18 X RAY DECT (DISPOSABLE) ×3 IMPLANT
STAPLER VISISTAT 35W (STAPLE) ×6 IMPLANT
STOCKINETTE IMPERVIOUS 9X36 MD (GAUZE/BANDAGES/DRESSINGS) ×6 IMPLANT
SUCTION FRAZIER HANDLE 10FR (MISCELLANEOUS)
SUCTION TUBE FRAZIER 10FR DISP (MISCELLANEOUS) IMPLANT
SUT ETHIBOND 2 V 37 (SUTURE) IMPLANT
SUT FIBERWIRE #2 38 REV NDL BL (SUTURE) ×24
SUT PDS AB 4-0 P3 18 (SUTURE) IMPLANT
SUT VIC AB 0 CT1 27 (SUTURE) ×4
SUT VIC AB 0 CT1 27XBRD ANBCTR (SUTURE) ×2 IMPLANT
SUT VIC AB 1 CT1 27 (SUTURE) ×6
SUT VIC AB 1 CT1 27XBRD ANBCTR (SUTURE) ×3 IMPLANT
SUT VIC AB 2-0 CT1 27 (SUTURE) ×10
SUT VIC AB 2-0 CT1 TAPERPNT 27 (SUTURE) ×5 IMPLANT
SUT VIC AB 2-0 CTB1 (SUTURE) ×3 IMPLANT
SUTURE FIBERWR#2 38 REV NDL BL (SUTURE) ×8 IMPLANT
SYR CONTROL 10ML LL (SYRINGE) ×3 IMPLANT
TOWEL OR 17X24 6PK STRL BLUE (TOWEL DISPOSABLE) ×3 IMPLANT
TOWEL OR 17X26 10 PK STRL BLUE (TOWEL DISPOSABLE) ×3 IMPLANT
TUBE CONNECTING 12'X1/4 (SUCTIONS) ×1
TUBE CONNECTING 12X1/4 (SUCTIONS) ×2 IMPLANT
UNDERPAD 30X30 (UNDERPADS AND DIAPERS) IMPLANT
WATER STERILE IRR 1000ML POUR (IV SOLUTION) IMPLANT
YANKAUER SUCT BULB TIP NO VENT (SUCTIONS) ×3 IMPLANT

## 2016-03-09 NOTE — Anesthesia Procedure Notes (Signed)
Procedure Name: Intubation Date/Time: 03/09/2016 4:19 PM Performed by: Jefm MilesENNIE, Goldy Calandra E Pre-anesthesia Checklist: Patient identified, Emergency Drugs available, Suction available, Patient being monitored and Timeout performed Patient Re-evaluated:Patient Re-evaluated prior to inductionOxygen Delivery Method: Circle system utilized Preoxygenation: Pre-oxygenation with 100% oxygen Intubation Type: IV induction and Rapid sequence Laryngoscope Size: Glidescope and 3 Grade View: Grade I Tube type: Oral Tube size: 7.5 mm Number of attempts: 1 Airway Equipment and Method: Stylet and Video-laryngoscopy Placement Confirmation: ETT inserted through vocal cords under direct vision,  positive ETCO2 and breath sounds checked- equal and bilateral Secured at: 21 cm Dental Injury: Teeth and Oropharynx as per pre-operative assessment

## 2016-03-09 NOTE — Progress Notes (Signed)
Subjective: Pt better with knee immobilizers in place.   Objective: Vital signs in last 24 hours: Temp:  [97.8 F (36.6 C)-98 F (36.7 C)] 98 F (36.7 C) (10/25 0900) Pulse Rate:  [82-94] 86 (10/25 0900) Resp:  [20] 20 (10/25 0900) BP: (142-159)/(86-88) 142/86 (10/25 0900) SpO2:  [98 %-100 %] 100 % (10/25 0900)  Intake/Output from previous day: 10/24 0701 - 10/25 0700 In: 930 [P.O.:930] Out: 1545 [Urine:1545] Intake/Output this shift: No intake/output data recorded.   Recent Labs  03/08/16 0039 03/08/16 0814 03/09/16 0550  HGB 14.2 13.0 13.5    Recent Labs  03/08/16 0814 03/09/16 0550  WBC 5.9 5.1  RBC 4.43 4.67  HCT 38.9* 40.8  PLT 218 219    Recent Labs  03/08/16 0814 03/09/16 0550  NA 134* 136  K 3.9 3.9  CL 99* 99*  CO2 26 28  BUN 18 17  CREATININE 1.51* 1.43*  GLUCOSE 125* 130*  CALCIUM 9.0 9.1   No results for input(s): LABPT, INR in the last 72 hours.  Neurologically intact ABD soft Neurovascular intact Sensation intact distally Intact pulses distally Compartment soft unable to lifrt either leg.  R leg palp gap at Quad insertion.  L leg Palp gap at quad insertion  Assessment/Plan: Bilateral quad tendon ruptures as result of work related injury .// Pt needs both Quad tendons repaired.  He understands risks and benefits including but not limited to bleeding , infection, need for further surgery and death at or around the time of surgery.   Payden Bonus L 03/09/2016, 2:02 PM

## 2016-03-09 NOTE — Progress Notes (Signed)
Frank Olsen ZOX:[id number] DOB: 1980/12/23 DOA: 03/07/2016 PCP: No primary care provider on file.  Brief narrative:  35 y.o. ? diabetes mellitus type 2 and hypertension presents to the ER with right knee pain.  Patient had sustained injury to his left knee 2 weeks ago . And at that time was told patient probably had a patellar tendon rupture Came in with recent fall and twist R knee   Past medical history-As per Problem list Chart reviewed as below-   Consultants:  ortho  Procedures:   none yet  Antibiotics:   MRI knees   Subjective   fair Standing with assist No overt pain  No SOPB   Objective     Objective: Vitals:   03/08/16 1027 03/08/16 2058 03/09/16 0556 03/09/16 0900  BP: (!) 144/98 (!) 159/88 (!) 156/88 (!) 142/86  Pulse: 85 94 82 86  Resp: 18 20 20 20   Temp: 97.9 F (36.6 C) 97.8 F (36.6 C) 97.8 F (36.6 C) 98 F (36.7 C)  TempSrc: Oral   Oral  SpO2: 99% 98% 100% 100%  Weight:      Height:        Intake/Output Summary (Last 24 hours) at 03/09/16 1054 Last data filed at 03/09/16 0700  Gross per 24 hour  Intake              420 ml  Output             1545 ml  Net            -1125 ml    Exam:  General: alert standing washing face no acute distress Cardiovascular: s1 s 2no m/r/g Respiratory: clear no added sound Abdomen:  sof tnt nd no rebound   Data Reviewed: Basic Metabolic Panel:  Recent Labs Lab 03/08/16 0039 03/08/16 0814 03/09/16 0550  NA 135 134* 136  K 4.0 3.9 3.9  CL 100* 99* 99*  CO2 25 26 28   GLUCOSE 148* 125* 130*  BUN 17 18 17   CREATININE 1.37* 1.51* 1.43*  CALCIUM 9.3 9.0 9.1   Liver Function Tests:  Recent Labs Lab 03/08/16 0814  AST 22  ALT 35  ALKPHOS 55  BILITOT 1.1  PROT 6.5  ALBUMIN 3.6   No results for input(s): LIPASE, AMYLASE in the last 168 hours. No results for input(s): AMMONIA in the last 168 hours. CBC:  Recent Labs Lab 03/08/16 0039 03/08/16 0814 03/09/16 0550  WBC 7.7  5.9 5.1  NEUTROABS 4.8 3.1  --   HGB 14.2 13.0 13.5  HCT 41.9 38.9* 40.8  MCV 86.4 87.8 87.4  PLT 248 218 219   Cardiac Enzymes: No results for input(s): CKTOTAL, CKMB, CKMBINDEX, TROPONINI in the last 168 hours. BNP: Invalid input(s): POCBNP CBG:  Recent Labs Lab 03/08/16 0345 03/08/16 0745 03/08/16 1227 03/08/16 1945 03/09/16 0743  GLUCAP 148* 118* 143* 99 110*    Recent Results (from the past 240 hour(s))  Surgical pcr screen     Status: None   Collection Time: 03/08/16 11:47 PM  Result Value Ref Range Status   MRSA, PCR NEGATIVE NEGATIVE Final   Staphylococcus aureus NEGATIVE NEGATIVE Final    Comment:        The Xpert SA Assay (FDA approved for NASAL specimens in patients over 49 years of age), is one component of a comprehensive surveillance program.  Test performance has been validated by [facility] for patients greater than or equal to 32 year old. It is not intended to  diagnose infection nor to guide or monitor treatment.      Studies:              All Imaging reviewed and is as per above notation   Scheduled Meds: .  ceFAZolin (ANCEF) IV  3 g Intravenous To SSTC  . lisinopril  20 mg Oral Daily   And  . hydrochlorothiazide  25 mg Oral Daily  . Influenza vac split quadrivalent PF  0.5 mL Intramuscular Tomorrow-1000  . insulin aspart  0-9 Units Subcutaneous TID WC  . povidone-iodine  2 application Topical Once   Continuous Infusions:    Assessment/Plan:  1. L quad tear-for surgery 10/25.  I have asked Ortho to assume management of his care given he is primarily a surgical candidate but am happy to stay on if prn 2. Htn-probably poorly controlled 2/2 to pain--increase Lisinopril to 40.  Allow for better ctrll 3. DM pretty well ctllr at present.  On metfromin at home 1000 bid which he can continue on d/c home 4. Body mass index is 56.96 kg/m.-life-threaeninng super-obese.  Needs OP counselling    D/w patient and friend Inpatient pending  surgery   Pleas Koch, MD  [facility] Pager [phone] 03/09/2016, 10:54 AM    LOS: 1 day

## 2016-03-09 NOTE — Transfer of Care (Signed)
Immediate Anesthesia Transfer of Care Note  Patient: Frank Olsen  Procedure(s) Performed: Procedure(s): REPAIR LEFT QUADRICEP TENDON RUPTURE & REPAIR RIGHT QUADRICEP RUPTURE OR PATELLA TENDON RUPTURE. (Bilateral)  Patient Location: PACU  Anesthesia Type:General  Level of Consciousness: awake, alert  and oriented  Airway & Oxygen Therapy: Patient Spontanous Breathing and Patient connected to nasal cannula oxygen  Post-op Assessment: Report given to RN  Post vital signs: Reviewed and stable  Last Vitals:  Vitals:   03/09/16 0556 03/09/16 0900  BP: (!) 156/88 (!) 142/86  Pulse: 82 86  Resp: 20 20  Temp: 36.6 C 36.7 C    Last Pain:  Vitals:   03/09/16 0900  TempSrc: Oral  PainSc:          Complications: No apparent anesthesia complications

## 2016-03-09 NOTE — Brief Op Note (Signed)
03/07/2016 - 03/09/2016  6:35 PM  PATIENT:  Frank Olsen  35 y.o. male  PRE-OPERATIVE DIAGNOSIS:  LEFT QUADRICEPS TENDON RUPTURE AND RIGHT QUADRICEPS TENDON RUPTURE OR PATELLA TENDON RUPTURE  POST-OPERATIVE DIAGNOSIS:  LEFT QUADRICEPS TENDON RUPTURE AND RIGHT QUADRICEPS TENDON RUPTURE OR PATELLA TENDON RUPTURE  PROCEDURE:  Procedure(s): REPAIR LEFT QUADRICEP TENDON RUPTURE & REPAIR RIGHT QUADRICEP RUPTURE OR PATELLA TENDON RUPTURE. (Bilateral)  SURGEON:  Surgeon(s) and Role:    * Jodi GeraldsJohn Del Wiseman, MD - Primary  PHYSICIAN ASSISTANT:   ASSISTANTS: jim bethunePAC   ANESTHESIA:   general  EBL:  Total I/O In: 1000 [I.V.:1000] Out: 100 [Blood:100]  BLOOD ADMINISTERED:none  DRAINS: none   LOCAL MEDICATIONS USED:  MARCAINE     SPECIMEN:  No Specimen  DISPOSITION OF SPECIMEN:  N/A  COUNTS:  YES  TOURNIQUET:   Total Tourniquet Time Documented: Thigh (Left) - 64 minutes Total: Thigh (Left) - 64 minutes  Thigh (Right) - 59 minutes Total: Thigh (Right) - 59 minutes   DICTATION: .Other Dictation: Dictation Number [id number]  PLAN OF CARE: Admit to inpatient   PATIENT DISPOSITION:  PACU - hemodynamically stable.   Delay start of Pharmacological VTE agent (>24hrs) due to surgical blood loss or risk of bleeding: no

## 2016-03-09 NOTE — Anesthesia Preprocedure Evaluation (Addendum)
Anesthesia Evaluation  Patient identified by MRN, date of birth, ID band Patient awake    Reviewed: Allergy & Precautions, NPO status , Patient's Chart, lab work & pertinent test results  Airway Mallampati: II  TM Distance: >3 FB Neck ROM: Full    Dental no notable dental hx. (+) Teeth Intact, Dental Advisory Given   Pulmonary neg pulmonary ROS,    Pulmonary exam normal breath sounds clear to auscultation       Cardiovascular hypertension, Pt. on medications Normal cardiovascular exam Rhythm:Regular Rate:Normal     Neuro/Psych negative neurological ROS  negative psych ROS   GI/Hepatic negative GI ROS, Neg liver ROS,   Endo/Other  diabetes, Type 2, Oral Hypoglycemic AgentsMorbid obesity  Renal/GU negative Renal ROS  negative genitourinary   Musculoskeletal negative musculoskeletal ROS (+)   Abdominal   Peds negative pediatric ROS (+)  Hematology negative hematology ROS (+)   Anesthesia Other Findings   Reproductive/Obstetrics negative OB ROS                           Anesthesia Physical Anesthesia Plan  ASA: III  Anesthesia Plan: General   Post-op Pain Management:    Induction: Intravenous  Airway Management Planned: Oral ETT  Additional Equipment:   Intra-op Plan:   Post-operative Plan: Extubation in OR  Informed Consent: I have reviewed the patients History and Physical, chart, labs and discussed the procedure including the risks, benefits and alternatives for the proposed anesthesia with the patient or authorized representative who has indicated his/her understanding and acceptance.   Dental advisory given  Plan Discussed with: CRNA  Anesthesia Plan Comments:         Anesthesia Quick Evaluation

## 2016-03-10 ENCOUNTER — Encounter (HOSPITAL_COMMUNITY): Payer: Self-pay | Admitting: Orthopedic Surgery

## 2016-03-10 DIAGNOSIS — S76111A Strain of right quadriceps muscle, fascia and tendon, initial encounter: Principal | ICD-10-CM

## 2016-03-10 LAB — CBC WITH DIFFERENTIAL/PLATELET
BASOS ABS: 0 10*3/uL (ref 0.0–0.1)
BASOS PCT: 0 %
EOS PCT: 0 %
Eosinophils Absolute: 0 10*3/uL (ref 0.0–0.7)
HCT: 40.8 % (ref 39.0–52.0)
Hemoglobin: 13.6 g/dL (ref 13.0–17.0)
LYMPHS PCT: 11 %
Lymphs Abs: 1 10*3/uL (ref 0.7–4.0)
MCH: 29.4 pg (ref 26.0–34.0)
MCHC: 33.3 g/dL (ref 30.0–36.0)
MCV: 88.1 fL (ref 78.0–100.0)
MONO ABS: 0.6 10*3/uL (ref 0.1–1.0)
Monocytes Relative: 6 %
NEUTROS ABS: 7.6 10*3/uL (ref 1.7–7.7)
Neutrophils Relative %: 83 %
PLATELETS: 214 10*3/uL (ref 150–400)
RBC: 4.63 MIL/uL (ref 4.22–5.81)
RDW: 11.8 % (ref 11.5–15.5)
WBC: 9.1 10*3/uL (ref 4.0–10.5)

## 2016-03-10 LAB — BASIC METABOLIC PANEL
Anion gap: 10 (ref 5–15)
BUN: 16 mg/dL (ref 6–20)
CO2: 28 mmol/L (ref 22–32)
Calcium: 9.1 mg/dL (ref 8.9–10.3)
Chloride: 96 mmol/L — ABNORMAL LOW (ref 101–111)
Creatinine, Ser: 1.46 mg/dL — ABNORMAL HIGH (ref 0.61–1.24)
GFR calc Af Amer: >60 mL/min (ref >60)
GFR calc non Af Amer: >60 mL/min (ref >60)
Glucose, Bld: 165 mg/dL — ABNORMAL HIGH (ref 65–99)
Potassium: 4.6 mmol/L (ref 3.5–5.1)
Sodium: 134 mmol/L — ABNORMAL LOW (ref 135–145)

## 2016-03-10 LAB — GLUCOSE, CAPILLARY
GLUCOSE-CAPILLARY: 136 mg/dL — AB (ref 65–99)
GLUCOSE-CAPILLARY: 122 mg/dL — AB (ref 65–99)
Glucose-Capillary: 119 mg/dL — ABNORMAL HIGH (ref 65–99)

## 2016-03-10 NOTE — Progress Notes (Signed)
Subjective: 1 Day Post-Op Procedure(s) (LRB): REPAIR LEFT QUADRICEP TENDON RUPTURE & REPAIR RIGHT QUADRICEP RUPTURE OR PATELLA TENDON RUPTURE. (Bilateral) Patient reports pain as mild.    Objective: Vital signs in last 24 hours: Temp:  [97.5 F (36.4 C)-98.5 F (36.9 C)] 97.8 F (36.6 C) (10/26 0557) Pulse Rate:  [86-102] 88 (10/26 0557) Resp:  [9-31] 19 (10/26 0557) BP: (136-182)/(76-114) 150/91 (10/26 0557) SpO2:  [97 %-100 %] 97 % (10/26 0557)  Intake/Output from previous day: 10/25 0701 - 10/26 0700 In: 2101.7 [P.O.:120; I.V.:1781.7; IV Piggyback:200] Out: 900 [Urine:800; Blood:100] Intake/Output this shift: No intake/output data recorded.   Recent Labs  03/08/16 0039 03/08/16 0814 03/09/16 0550 03/10/16 0529  HGB 14.2 13.0 13.5 13.6    Recent Labs  03/09/16 0550 03/10/16 0529  WBC 5.1 9.1  RBC 4.67 4.63  HCT 40.8 40.8  PLT 219 214    Recent Labs  03/09/16 0550 03/10/16 0529  NA 136 134*  K 3.9 4.6  CL 99* 96*  CO2 28 28  BUN 17 16  CREATININE 1.43* 1.46*  GLUCOSE 130* 165*  CALCIUM 9.1 9.1   No results for input(s): LABPT, INR in the last 72 hours.  Neurologically intact ABD soft Neurovascular intact No cellulitis present Compartment soft  Assessment/Plan: 1 Day Post-Op Procedure(s) (LRB): REPAIR LEFT QUADRICEP TENDON RUPTURE & REPAIR RIGHT QUADRICEP RUPTURE OR PATELLA TENDON RUPTURE. (Bilateral) Advance diet Up with therapy Discharge home with home health  Veverly Larimer L 03/10/2016, 8:30 AM

## 2016-03-10 NOTE — Progress Notes (Signed)
Physical Therapy Treatment Patient Details Name: Frank Olsen MRN: [id number] DOB: 1980/11/26 Today's Date: 03/10/2016    History of Present Illness Frank Olsen is a 35 y.o. male with diabetes mellitus type 2 and hypertension presents to the ER with right knee pain. Patient had sustained injury to his left knee 2 weeks ago.  Patient with bilateral quad tendon ruptures, now s/p bil quad tendon repair on 03/09/16.    PT Comments    Patient making progress toward goals.  Able to ambulate 5242' with RW and min guard assist.  Follow Up Recommendations  Home health PT;Supervision for mobility/OOB     Equipment Recommendations  Rolling walker with 5" wheels;3in1 (PT) (Wide)    Recommendations for Other Services OT consult     Precautions / Restrictions Precautions Precautions: Fall Required Braces or Orthoses: Knee Immobilizer - Right;Knee Immobilizer - Left Knee Immobilizer - Right: On at all times Knee Immobilizer - Left: On at all times Restrictions Weight Bearing Restrictions: Yes RLE Weight Bearing: Weight bearing as tolerated LLE Weight Bearing: Weight bearing as tolerated    Mobility  Bed Mobility Overal bed mobility: Needs Assistance Bed Mobility: Supine to Sit     Supine to sit: Min guard     General bed mobility comments: Cues for technique.  Use of rail.  Assist for safety only.  Transfers Overall transfer level: Needs assistance Equipment used: Rolling walker (2 wheeled) Transfers: Sit to/from Stand Sit to Stand: Min assist;From elevated surface         General transfer comment: Min assist to steady during transfer.  Elevated bed to height of bed at home.  Increased time to complete transfers.  Cues for chair placement and to use high seat surface for safer transfers.  Ambulation/Gait Ambulation/Gait assistance: Min guard Ambulation Distance (Feet): 42 Feet Assistive device: Rolling walker (2 wheeled) Gait Pattern/deviations: Step-through  pattern;Decreased step length - right;Decreased step length - left;Decreased stride length;Shuffle;Trunk flexed;Wide base of support Gait velocity: decreased Gait velocity interpretation: Below normal speed for age/gender General Gait Details: Cues to stand upright.  Short steps due to inability to flex knees.  Slow, steady gait.   Stairs Stairs:  (Verbal cues/demonstration for short step to get onto porch)          Engineer, drillingWheelchair Mobility    Modified Rankin (Stroke Patients Only)       Balance Overall balance assessment: Needs assistance         Standing balance support: Bilateral upper extremity supported Standing balance-Leahy Scale: Poor                      Cognition Arousal/Alertness: Awake/alert Behavior During Therapy: WFL for tasks assessed/performed Overall Cognitive Status: Within Functional Limits for tasks assessed                      Exercises      General Comments        Pertinent Vitals/Pain Pain Assessment: 0-10 Pain Score: 8  (6 prior to session; 8 following gait) Pain Location: Bil knees Pain Descriptors / Indicators: Aching;Sore Pain Intervention(s): Limited activity within patient's tolerance;Monitored during session;Repositioned;Patient requesting pain meds-RN notified    Home Living                      Prior Function            PT Goals (current goals can now be found in the care plan section) Acute Rehab PT Goals  Patient Stated Goal: To be able to go home Progress towards PT goals: Progressing toward goals    Frequency    Min 6X/week      PT Plan Current plan remains appropriate    Co-evaluation             End of Session Equipment Utilized During Treatment: Gait belt;Right knee immobilizer;Left knee immobilizer Activity Tolerance: Patient tolerated treatment well;Patient limited by pain Patient left: in chair;with call bell/phone within reach;with family/visitor present     Time:  1610-9604 PT Time Calculation (min) (ACUTE ONLY): 36 min  Charges:  $Gait Training: 23-37 mins                    G Codes:      Vena Austria April 04, 2016, 11:04 AM Durenda Hurt. Renaldo Fiddler, [facility] Acute Rehab Services Pager [phone]

## 2016-03-10 NOTE — Progress Notes (Signed)
Discharge instruction reviewed with patient and spouse, questions and concerns answered, patient to follow up with orthopedic surgeon, prescription given, IV removed Stanford BreedBracey, Zeah Germano N RN 03-10-2016 3:47 PM

## 2016-03-10 NOTE — Op Note (Signed)
NAME:  Frank Olsen, Frank Olsen               ACCOUNT NO.:  [id number]  MEDICAL RECORD NO.:  [id number]  LOCATION:  [id number]                        FACILITY:  [facility]  PHYSICIAN:  Harvie JuniorJohn L. Linsi Humann, M.D.   DATE OF BIRTH:  03/02/1981  DATE OF PROCEDURE:  03/09/2016 DATE OF DISCHARGE:                              OPERATIVE REPORT   PREOPERATIVE DIAGNOSIS:  Bilateral quadriceps tendon rupture.  POSTOPERATIVE DIAGNOSIS:  Bilateral quadriceps tendon rupture.  PROCEDURES: 1. Left quadriceps tendon repair with a double-core #2 FiberWire with     medial and lateral #2 FiberWires. 2. Right quadriceps tendon repair with a double-core #2 FiberWire and     a medial and lateral #2 FiberWires.  SURGEON:  Harvie JuniorJohn L. Markeith Jue, M.D.  ASSISTANT:  Marshia LyJames Bethune, P.A.  ANESTHESIA:  General.  BRIEF HISTORY:  Frank Olsen is a 35 year old male, who was at work and had a misstep and unfortunately injured his knee.  He was evaluated and felt to have some sort of a tear, was not given a knee immobilizer, was attempted to be on his own and was not doing well.  He had a subsequent fall and presented to the emergency room where they had made no diagnosis.  He then came to [facility] in the middle of the night where the emergency room physician felt that he had meniscal tear and felt that he could not go home.  He was admitted to the Medical Service and we were consulted for evaluation of his bilateral knee problems. Upon evaluation, noted him to have bilateral quadriceps tendon repair. Got MRIs just to confirm and also make sure there was no other pathology and he ultimately taken to the operating room for fixation of these problems.  DESCRIPTION OF PROCEDURE:  The patient was taken to the operating room. After adequate anesthesia was obtained with general anesthetic, the patient was placed supine on the operating table.  Right and left legs were prepped and draped in usual sterile fashion.  Following this, the left leg  was addressed initially.  The leg was exsanguinated.  Blood pressure tourniquet was inflated to 350 mmHg.  Following this, a midline incision was made subcutaneous tissue down the level of the extensor mechanism and there was a very devastating injury on this side.  The quadriceps had pulled completely off the bone and it really ruptured through the VMO on the right side, ruptured through the VMO on the medial side, ruptured through the retinaculum on the lateral side.  I put a central core of #2 FiberWire stitches in a locking Bunnell fashion, then did a medial #2 FiberWire and lateral #2 FiberWire in a locking Bunnell fashion.  Once this was done, four drill holes were made through the patella and these sutures were brought through the drill holes and then tied distally.  We could get him to about 45 degrees of flexion before there was tension on the repair, may be a little less than that and at this point, the retinaculum was repaired with #1 Vicryl running, and there was a deep layer of 0 Vicryl that we could close over the repair, then the skin was closed with 0 and 2-0 Vicryl, and skin  staples.  Tourniquet was let down at this point.  Estimated tourniquet time was about an hour, final time could be gotten from the anesthetic record.  Attention at this time was turned to the right knee.  The leg was exsanguinated.  Blood pressure tourniquet was inflated to 350 mmHg and midline incision was made subcutaneous tissue down to the level of extensor mechanism.  This appeared to be not as chronic on this side and at this point, a double-core #2 FiberWire was passed and locked in a Bunnell fashion, and medial and lateral FiberWires were passed and then four drill holes were placed through the patella and these were brought through the drill holes and tied in place.  Retinaculum was closed with 1 Vicryl running, skin with 0 and 2-0 Vicryl and skin staples.  We keep him about 45 on this side  before there was any tension on the repair. At this point, sterile compressive dressing was applied on both sides after the wound had been closed with 0 and 2-0 Vicryl, and skin staples. The patient placed in bilateral knee immobilizers and taken to the recovery room, he was noted to be in satisfactory condition.  Estimated blood loss for the procedure was minimal.     Harvie Junior, M.D.     Ranae Plumber  D:  03/09/2016  T:  03/10/2016  Job:  [id number]

## 2016-03-10 NOTE — Discharge Summary (Signed)
Physician Discharge Summary  Frank Olsen WUJ:[id number] DOB: 1980-06-24 DOA: 03/07/2016  PCP: No primary care provider on file.  Admit date: 03/07/2016 Discharge date: 03/10/2016  Time spent: 15 minutes  Recommendations for Outpatient Follow-up:  1. Needs OP follow up orthopedics for Patellar repair follow up 2. PAin meds rx by ortho on d/c-to follow with them if prn 3. To see PCP and adjust HTn meds if BP still elevated after d/c  Discharge Diagnoses:  Principal Problem:   Traumatic rupture of right quadriceps tendon Active Problems:   Knee injury, right, initial encounter   Diabetes mellitus type 2, controlled (HCC)   Essential hypertension   Knee pain   Severe obesity (BMI >= 40) (HCC)   Traumatic rupture of left quadriceps tendon   Discharge Condition: fair  Diet recommendation:  Diabetic heart healthy  Filed Weights   03/07/16 1841  Weight: (!) 190.5 kg (420 lb)    History of present illness:  35 y.o.? diabetes mellitus type 2 and hypertension presents to the ER with right knee pain.  Patient had sustained injury to his left knee 2 weeks ago . And at that time was told patient probably had a patellar tendon rupture Came in with recent fall and twist R knee  Found to have bilateral quadricep tendon ruptures Underwent repair of the same 10/25 Therapy mobilised the patient and he was felt to be stable for d/c on 03/10/16   PROCEDURES: 1. Left quadriceps tendon repair with a double-core #2 FiberWire with     medial and lateral #2 FiberWires. 2. Right quadriceps tendon repair with a double-core #2 FiberWire and     a medial and lateral #2 FiberWires.  Consultations:  Dr Luiz Blare ortho  Discharge Exam: Vitals:   03/10/16 0557 03/10/16 0918  BP: (!) 150/91 (!) 143/84  Pulse: 88 93  Resp: 19 19  Temp: 97.8 F (36.6 C) 98.4 F (36.9 C)    General: eomi ncat Cardiovascular: s1 s 2no m/r/g Respiratory: clear no added sound  Discharge  Instructions    Current Discharge Medication List    START taking these medications   Details  aspirin EC 325 MG tablet Take 1 tablet (325 mg total) by mouth 2 (two) times daily after a meal. Take x 1 month post op to decrease risk of blood clots. Qty: 60 tablet, Refills: 0    methocarbamol (ROBAXIN-750) 750 MG tablet Take 1 tablet (750 mg total) by mouth every 8 (eight) hours as needed for muscle spasms. Qty: 60 tablet, Refills: 0      CONTINUE these medications which have CHANGED   Details  oxyCODONE-acetaminophen (PERCOCET/ROXICET) 5-325 MG tablet Take 1-2 tablets by mouth every 4 (four) hours as needed for severe pain. Qty: 60 tablet, Refills: 0      CONTINUE these medications which have NOT CHANGED   Details  Apple Cid Vn-Grn Tea-Bit Or-Cr (APPLE CIDER VINEGAR PLUS) TABS Take 1 tablet by mouth 3 (three) times daily.    lisinopril-hydrochlorothiazide (PRINZIDE,ZESTORETIC) 20-25 MG tablet Take 1 tablet by mouth daily.    metFORMIN (GLUCOPHAGE) 1000 MG tablet Take 1,000 mg by mouth 2 (two) times daily with a meal.      STOP taking these medications     naproxen (NAPROSYN) 500 MG tablet        No Known Allergies Follow-up Information    GRAVES,JOHN L, MD. Schedule an appointment as soon as possible for a visit in 2 weeks.   Specialty:  Orthopedic Surgery Contact information: [address] [place] [address] [phone]            The results of significant diagnostics from this hospitalization (including imaging, microbiology, ancillary and laboratory) are listed below for reference.    Significant Diagnostic Studies: Mr Knee Right Wo Contrast  Result Date: 03/09/2016 CLINICAL DATA:  Injury 2 weeks ago involving the extensor mechanism with continued pain. EXAM: MRI OF THE RIGHT KNEE WITHOUT CONTRAST TECHNIQUE: Multiplanar, multisequence MR imaging of the knee was performed. No intravenous contrast was administered. COMPARISON:  03/07/2016 FINDINGS: Mildly  reduced signal to noise ratio due to coil selection limitations related to patient body habitus. Despite efforts by the technologist and patient, motion artifact is present on today's exam and could not be eliminated. This reduces exam sensitivity and specificity. MENISCI Medial meniscus:  Unremarkable Lateral meniscus:  Unremarkable LIGAMENTS Cruciates:  Unremarkable Collaterals:  Unremarkable CARTILAGE Patellofemoral: Moderate chondral thinning laterally along the lateral femoral trochlear groove. Medial:  Mild degenerative chondral thinning with marginal spurring. Lateral: Mild degenerative chondral thinning with marginal spurring. Joint: Small knee effusion with synovitis and possible blood products within the suprapatellar recess. Popliteal Fossa:  Unremarkable Extensor Mechanism: Ruptured distal quadriceps, about 10 mm of retraction. Extensive surrounding edema. Tendinopathy of the patellar tendon. Tearing of the upper portion of the medial patellar retinaculum. Edema within along the distal vastus musculature. Bones: No significant extra-articular osseous abnormalities identified. Other: No supplemental non-categorized findings. IMPRESSION: 1. Rupture distal quadriceps tendon, with about a 10 mm gap between the edematous distal margin of the tendon and the patella. Extensive surrounding edema potentially with a small amount of blood products and synovitis in the suprapatellar recess. Tendinopathy of the patellar tendon. Tearing of the upper portion of the medial patellar retinaculum. Edema along and within the distal vastus musculature. 2. Mild to moderate degrees of degenerative chondral thinning. 3. Patellar tendinopathy. Electronically Signed   By: Gaylyn Rong M.D.   On: 03/09/2016 08:49   Mr Knee Left Wo Contrast  Result Date: 03/09/2016 CLINICAL DATA:  Extensor tendon injury, knee pain. EXAM: MRI OF THE LEFT KNEE WITHOUT CONTRAST TECHNIQUE: Multiplanar, multisequence MR imaging of the knee  was performed. No intravenous contrast was administered. COMPARISON:  None. FINDINGS: Body habitus and required coil selection reduced diagnostic sensitivity and specificity. MENISCI Medial meniscus:  Unremarkable Lateral meniscus:  Unremarkable LIGAMENTS Cruciates:  Low-level edema in the ACL without overt tear. Collaterals:  Unremarkable CARTILAGE Patellofemoral:  Unremarkable Medial:  Mild degenerative chondral thinning. Lateral:  Unremarkable Joint:  Small knee effusion.  Mildly thickened medial plica. Popliteal Fossa:  Unremarkable Extensor Mechanism: Transverse focal tear/ rupture of the distal quadriceps tendon about 1.4 cm proximal to the patella, with a focal defect in this tendon at this level. The proximal and distal tendon segments adjacent to this rupture are edematous and thickened, with indistinct margins due to extensive surrounding edema. There is a cleft like appearance in the quadriceps tendon which extends over into the distal vastus medialis as shown on image 35/18 compatible with distal vastus medialis tear. There is also edema tracking along the distal vastus lateralis. Thickening and tendinopathy of the patellar tendon with laxity of the patellar tendon. Probable tear of the upper portion of the medial patellar retinaculum. Prepatellar subcutaneous edema. Bones: No significant extra-articular osseous abnormalities identified. Other: No supplemental non-categorized findings. IMPRESSION: 1. Cleft like rupture of the distal quadriceps tendon about 1.4 cm proximal to the patella, with the cleft like tear extending over medially into the distal vastus medialis muscle. Thickening  and blunting of both tendon segment margins. Surrounding inflammatory edema and edema tracking along the distal vastus musculature. Patellar tendinopathy and thickening. Probable tear of the upper portion of the medial patellar retinaculum. 2. Small knee effusion. 3. Mild degenerative chondral thinning in the medial  compartment. 4. Mild sprain of the ACL without overt tear. Electronically Signed   By: Gaylyn RongWalter  Liebkemann M.D.   On: 03/09/2016 08:54   Dg Knee Complete 4 Views Right  Result Date: 03/07/2016 CLINICAL DATA:  Generalized right knee pain after fall while reaching for crutches. EXAM: RIGHT KNEE - COMPLETE 4+ VIEW COMPARISON:  None. FINDINGS: Slight joint space narrowing of the femorotibial compartment with minimal spurring of the tibial spine and tibial plateau. No intra-articular loose body, fracture or bone destruction. No significant joint effusion. No gross malalignment. Soft tissues are grossly unremarkable. IMPRESSION: Mild femorotibial joint space narrowing and spurring without acute osseous abnormality. Electronically Signed   By: Tollie Ethavid  Kwon M.D.   On: 03/07/2016 20:09    Microbiology: Recent Results (from the past 240 hour(s))  Surgical pcr screen     Status: None   Collection Time: 03/08/16 11:47 PM  Result Value Ref Range Status   MRSA, PCR NEGATIVE NEGATIVE Final   Staphylococcus aureus NEGATIVE NEGATIVE Final    Comment:        The Xpert SA Assay (FDA approved for NASAL specimens in patients over 37 years of age), is one component of a comprehensive surveillance program.  Test performance has been validated by [facility] for patients greater than or equal to 37 year old. It is not intended to diagnose infection nor to guide or monitor treatment.      Labs: Basic Metabolic Panel:  Recent Labs Lab 03/08/16 0039 03/08/16 0814 03/09/16 0550 03/10/16 0529  NA 135 134* 136 134*  K 4.0 3.9 3.9 4.6  CL 100* 99* 99* 96*  CO2 25 26 28 28   GLUCOSE 148* 125* 130* 165*  BUN 17 18 17 16   CREATININE 1.37* 1.51* 1.43* 1.46*  CALCIUM 9.3 9.0 9.1 9.1   Liver Function Tests:  Recent Labs Lab 03/08/16 0814  AST 22  ALT 35  ALKPHOS 55  BILITOT 1.1  PROT 6.5  ALBUMIN 3.6   No results for input(s): LIPASE, AMYLASE in the last 168 hours. No results for input(s):  AMMONIA in the last 168 hours. CBC:  Recent Labs Lab 03/08/16 0039 03/08/16 0814 03/09/16 0550 03/10/16 0529  WBC 7.7 5.9 5.1 9.1  NEUTROABS 4.8 3.1  --  7.6  HGB 14.2 13.0 13.5 13.6  HCT 41.9 38.9* 40.8 40.8  MCV 86.4 87.8 87.4 88.1  PLT 248 218 219 214   Cardiac Enzymes: No results for input(s): CKTOTAL, CKMB, CKMBINDEX, TROPONINI in the last 168 hours. BNP: BNP (last 3 results) No results for input(s): BNP in the last 8760 hours.  ProBNP (last 3 results) No results for input(s): PROBNP in the last 8760 hours.  CBG:  Recent Labs Lab 03/09/16 1206 03/09/16 1423 03/09/16 1859 03/09/16 2146 03/10/16 0741  GLUCAP 138* 120* 167* 201* 119*       Signed:  Rhetta MuraSAMTANI, JAI-GURMUKH MD   [facility] 03/10/2016, 9:28 AM

## 2016-03-10 NOTE — Discharge Instructions (Signed)
Keep knee dressings dry. Wear knee immobilizers. You may remove them for heel slide exercises 0 to 30. Wear knee immobilizers at all times when up out of bed. You may weight-bear as tolerated. Elevate your knees as much as possible and apply ice packs underneath the knee immobilizers.  Information on my medicine - ELIQUIS (apixaban)  This medication education was reviewed with me or my healthcare representative as part of my discharge preparation.  The pharmacist that spoke with me during my hospital stay was:  [facility], Maryanna ShapeJennifer Danielle, [facility]  Why was Eliquis prescribed for you? Eliquis was prescribed for you to reduce the risk of blood clots forming after orthopedic surgery.    What do You need to know about Eliquis? Take your Eliquis TWICE DAILY - one tablet in the morning and one tablet in the evening with or without food.  It would be best to take the dose about the same time each day.  If you have difficulty swallowing the tablet whole please discuss with your pharmacist how to take the medication safely.  Take Eliquis exactly as prescribed by your doctor and DO NOT stop taking Eliquis without talking to the doctor who prescribed the medication.  Stopping without other medication to take the place of Eliquis may increase your risk of developing a clot.  After discharge, you should have regular check-up appointments with your healthcare provider that is prescribing your Eliquis.  What do you do if you miss a dose? If a dose of ELIQUIS is not taken at the scheduled time, take it as soon as possible on the same day and twice-daily administration should be resumed.  The dose should not be doubled to make up for a missed dose.  Do not take more than one tablet of ELIQUIS at the same time.  Important Safety Information A possible side effect of Eliquis is bleeding. You should call your healthcare provider right away if you experience any of the following: ? Bleeding from an  injury or your nose that does not stop. ? Unusual colored urine (red or dark brown) or unusual colored stools (red or black). ? Unusual bruising for unknown reasons. ? A serious fall or if you hit your head (even if there is no bleeding).  Some medicines may interact with Eliquis and might increase your risk of bleeding or clotting while on Eliquis. To help avoid this, consult your healthcare provider or pharmacist prior to using any new prescription or non-prescription medications, including herbals, vitamins, non-steroidal anti-inflammatory drugs (NSAIDs) and supplements.  This website has more information on Eliquis (apixaban): http://www.eliquis.com/eliquis/home

## 2016-03-10 NOTE — Progress Notes (Signed)
At the time of discharge we  felt the need for the patient to be anticoagulated.   We faxed in a prescription for Eliquis 2.5 mg340-1 twice daily 3 weeks for DVT prophylaxis.  This was sent to [place] pharmacy in [place].The patient's wife is aware of this.  We will see the patient in the office in 2 weeks.

## 2016-03-10 NOTE — Care Management Note (Signed)
Case Management Note  Patient Details  Name: Frank Olsen MRN: [id number] Date of Birth: 06-27-1980  Subjective/Objective:   CM following for progression and d/c planning.                  Action/Plan: 03/10/2016 Met with pt and wife re d/c needs, call placed to worker's comp case manager, Meredeth Ide @ [phone] and per her request info faxed to [phone] ( demographics, HH and DME orders, OR note, d/c summary). Pt wife was given 30 day free card for Eliquis. Noted that MD has sent prescriptions to pt pharmacy in [place], [place]. WC case manager notified of plan to d/c, info faxed, informed of 30 day free card for Eliquis, of prescription being sent pt pt pharmacy in [place], [place] and plan to obtain wide rolling walker in hospital for pt to travel home with. Per Ms Philippa Chester she will arrange the HHPT.    Expected Discharge Date:  03/10/16               Expected Discharge Plan:  [place]  In-House Referral:  NA  Discharge planning Services  CM Consult, Medication Assistance  Post Acute Care Choice:  Durable Medical Equipment, Home Health Choice offered to:   (Pt workers Therapist, occupational, Meredeth Ide @ [phone])  DME Arranged:  [facility] wide DME Agency:  [place]:  PT [place]:   (HHPT to be arranged by workers comp Tourist information centre manager, Meredeth Ide)  Status of Service:  Completed, signed off  If discussed at [place] of Stay Meetings, dates discussed:    Additional Comments:  Adron Bene, RN 03/10/2016, 10:58 AM

## 2016-03-11 NOTE — Anesthesia Postprocedure Evaluation (Signed)
Anesthesia Post Note  Patient: Mittie BodoLinwood Poulsen  Procedure(s) Performed: Procedure(s) (LRB): REPAIR LEFT QUADRICEP TENDON RUPTURE & REPAIR RIGHT QUADRICEP RUPTURE OR PATELLA TENDON RUPTURE. (Bilateral)  Patient location during evaluation: PACU Anesthesia Type: General Level of consciousness: awake and alert Pain management: pain level controlled Vital Signs Assessment: post-procedure vital signs reviewed and stable Respiratory status: spontaneous breathing, nonlabored ventilation, respiratory function stable and patient connected to nasal cannula oxygen Cardiovascular status: blood pressure returned to baseline and stable Postop Assessment: no signs of nausea or vomiting Anesthetic complications: no     Last Vitals:  Vitals:   03/10/16 1026 03/10/16 1400  BP: (!) 147/81 (!) 165/91  Pulse: (!) 102 100  Resp:  19  Temp:  37.1 C    Last Pain:  Vitals:   03/10/16 1400  TempSrc: Oral  PainSc:    Pain Goal: Patients Stated Pain Goal: 0 (03/10/16 0225)               Phillips Groutarignan, Eugenio Dollins

## 2017-08-25 IMAGING — MR MR KNEE*R* W/O CM
4 of 6 series · 18 of 40 positions shown · non-contrast
Comparison: 03/07/2016

CLINICAL DATA: Injury 2 weeks ago involving the extensor mechanism
with continued pain.

EXAM:
MRI OF THE RIGHT KNEE WITHOUT CONTRAST
TECHNIQUE: Multiplanar, multisequence MR imaging of the knee was performed. No
intravenous contrast was administered.

[Series 5: PD fat-sat · axial · 3.0mm · 0.35mm/px · z∈[-42,+85]mm · 4 of 38 slices shown (1 of 4)]
[im 1/38]
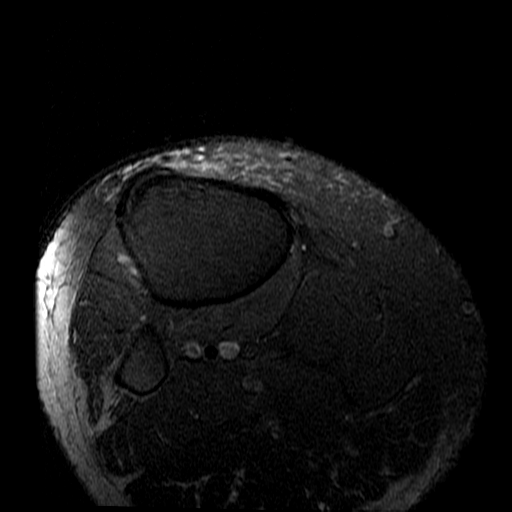
[im 13/38]
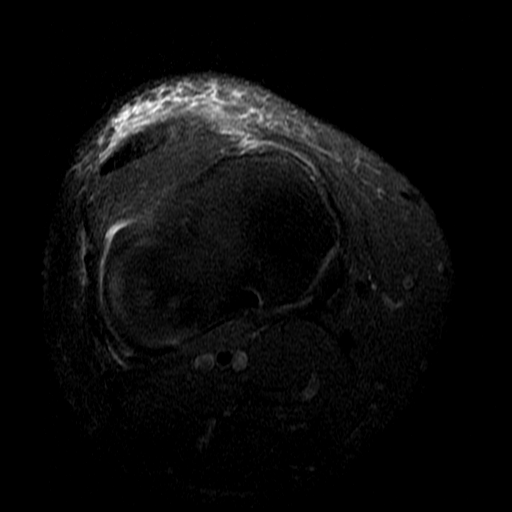
[im 25/38]
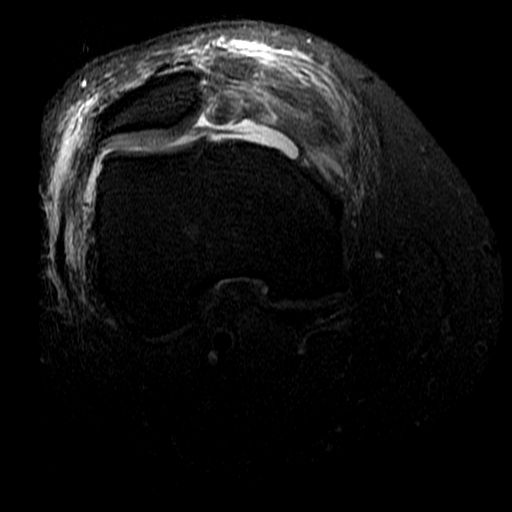
[im 38/38]
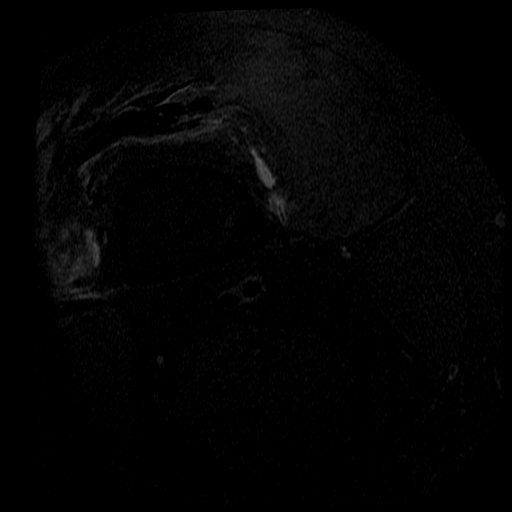

[Series 8: PD fat-sat · sagittal · 3.0mm · 0.39mm/px · 7 of 54 slices shown (2 of 4)]
[im 1/54]
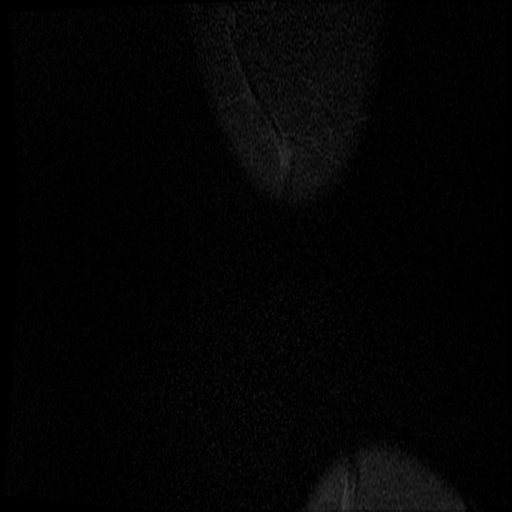
[im 9/54]
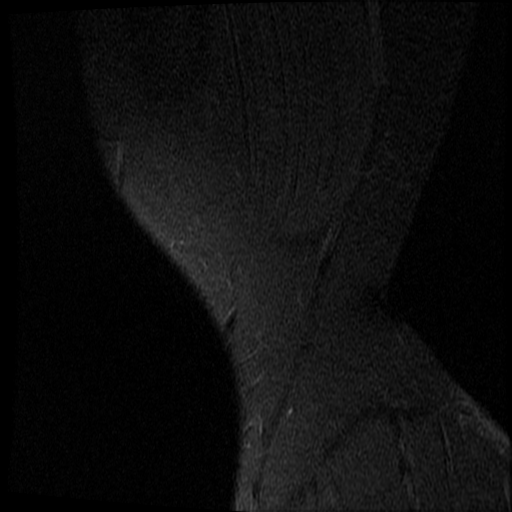
[im 18/54]
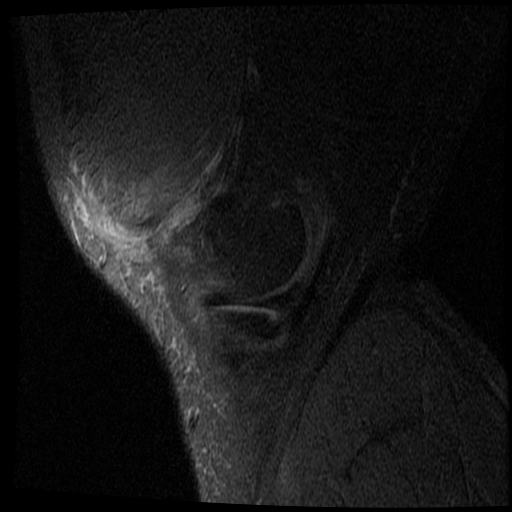
[im 27/54]
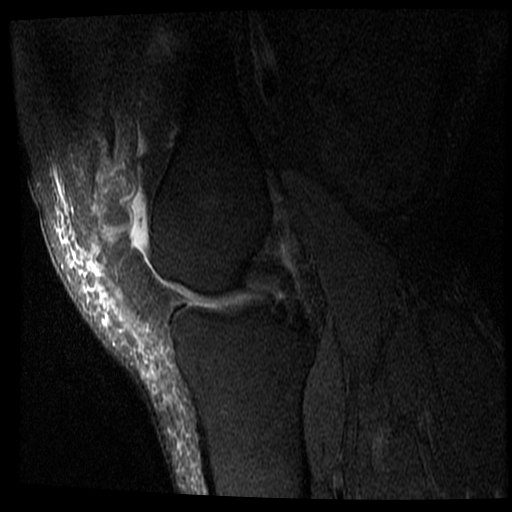
[im 36/54]
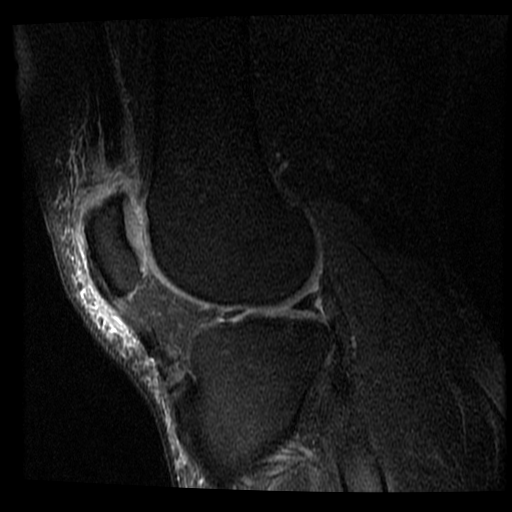
[im 45/54]
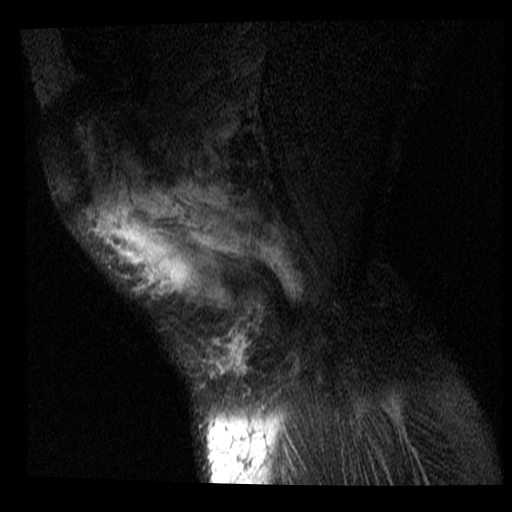
[im 54/54]
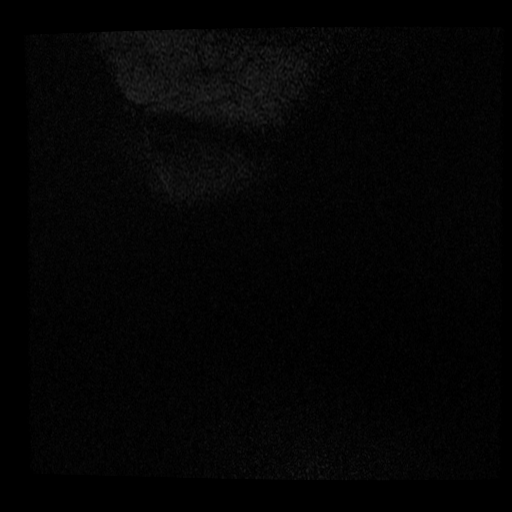

[Series 11: PD fat-sat · coronal · 3.0mm · 0.39mm/px · 4 of 59 slices shown (3 of 4)]
[im 1/59]
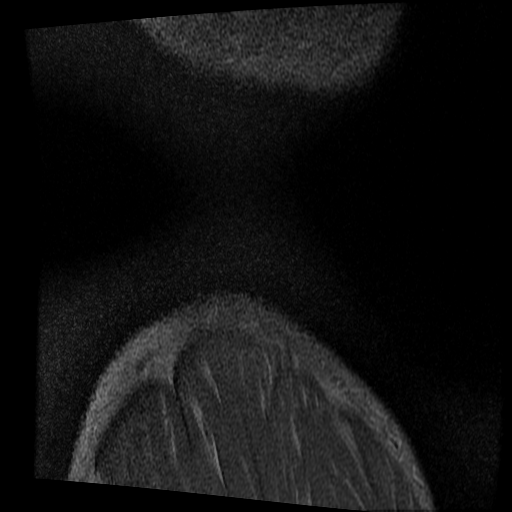
[im 9/59]
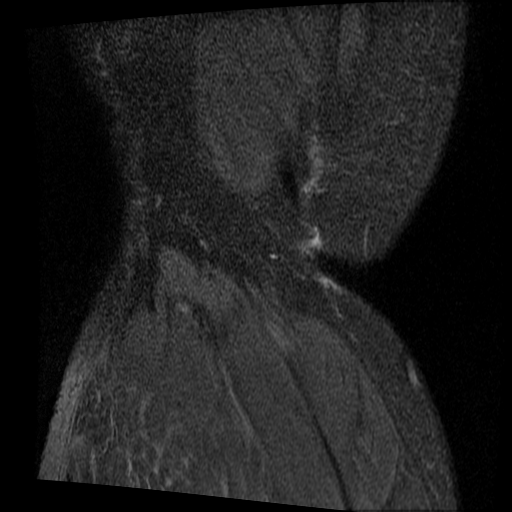
[im 34/59]
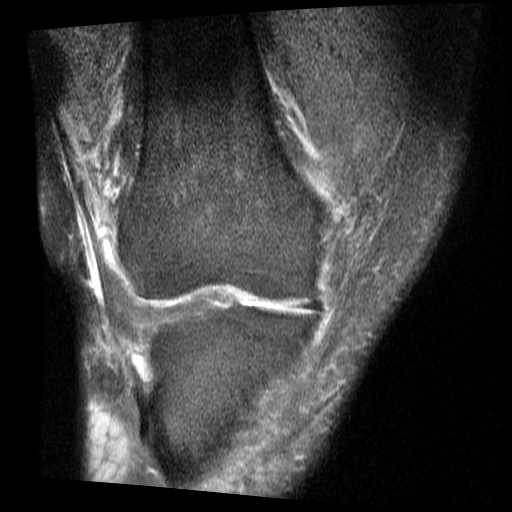
[im 50/59]
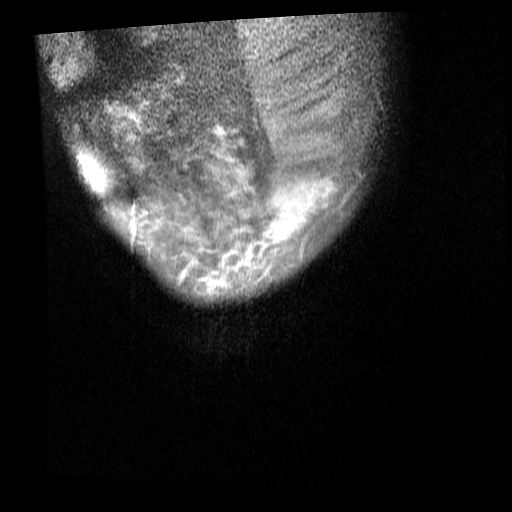

[Series 23: PD fat-sat · axial · 3.0mm · 0.35mm/px · z∈[-57,+64]mm · 3 of 36 slices shown (4 of 4)]
[im 1/36]
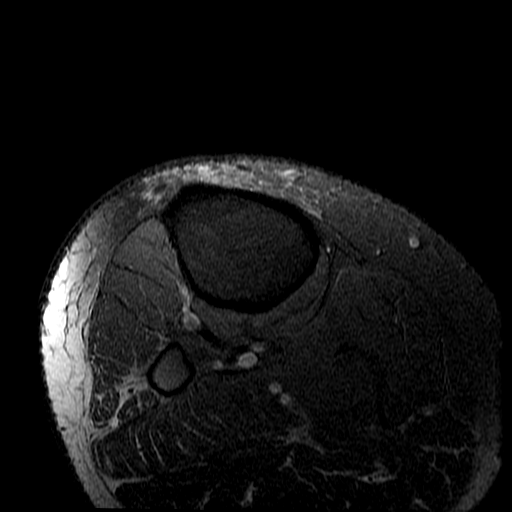
[im 18/36]
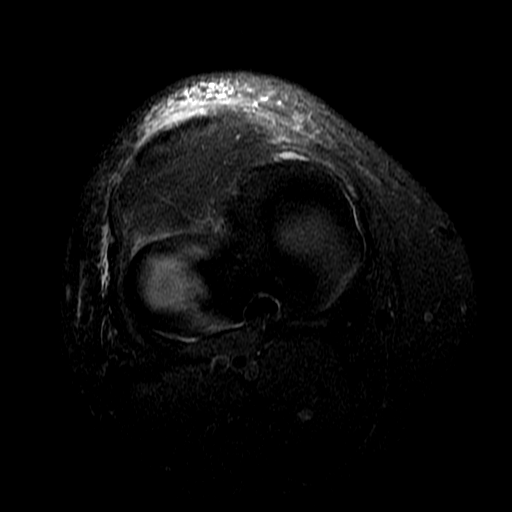
[im 36/36]
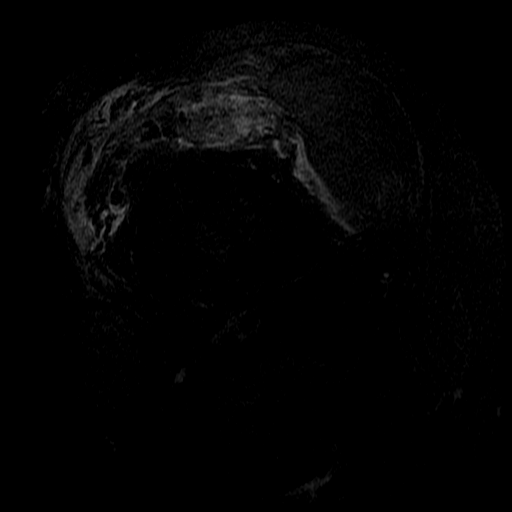

[18 of 40 positions shown; findings below may reference images not displayed]

FINDINGS: Mildly reduced signal to noise ratio due to coil selection
limitations related to patient body habitus. Despite efforts by the
technologist and patient, motion artifact is present on today's exam
and could not be eliminated. This reduces exam sensitivity and
specificity.

MENISCI

Medial meniscus:  Unremarkable

Lateral meniscus:  Unremarkable

LIGAMENTS

Cruciates:  Unremarkable

Collaterals:  Unremarkable

CARTILAGE

Patellofemoral: Moderate chondral thinning laterally along the
lateral femoral trochlear groove.

Medial:  Mild degenerative chondral thinning with marginal spurring.

Lateral: Mild degenerative chondral thinning with marginal spurring.

Joint: Small knee effusion with synovitis and possible blood
products within the suprapatellar recess.

Popliteal Fossa:  Unremarkable

Extensor Mechanism: Ruptured distal quadriceps, about 10 mm of
retraction. Extensive surrounding edema. Tendinopathy of the
patellar tendon. Tearing of the upper portion of the medial patellar
retinaculum. Edema within along the distal vastus musculature.

Bones: No significant extra-articular osseous abnormalities
identified.

Other: No supplemental non-categorized findings.
IMPRESSION: 1. Rupture distal quadriceps tendon, with about a 10 mm gap between
the edematous distal margin of the tendon and the patella. Extensive
surrounding edema potentially with a small amount of blood products
and synovitis in the suprapatellar recess. Tendinopathy of the
patellar tendon. Tearing of the upper portion of the medial patellar
retinaculum. Edema along and within the distal vastus musculature.
2. Mild to moderate degrees of degenerative chondral thinning.
3. Patellar tendinopathy.

## 2021-10-29 ENCOUNTER — Emergency Department: Admit: 2021-10-29 | Payer: MEDICAID

## 2021-10-29 ENCOUNTER — Observation Stay
Admit: 2021-10-29 | Discharge: 2021-11-02 | Disposition: A | Discharge status: Home / Self Care | Payer: MEDICAID | Admitting: Internal Medicine

## 2021-10-29 ENCOUNTER — Inpatient Hospital Stay
Admit: 2021-10-29 | Discharge: 2021-10-29 | Disposition: A | Discharge status: Other Acute Inpatient Hospital | Payer: MEDICAID | Attending: Emergency Medicine

## 2021-10-29 ENCOUNTER — Emergency Department: Payer: MEDICAID

## 2021-10-29 DIAGNOSIS — G459 Transient cerebral ischemic attack, unspecified: Secondary | ICD-10-CM

## 2021-10-29 LAB — CBC WITH AUTO DIFFERENTIAL
Absolute Immature Granulocyte: 0 10*3/uL (ref 0.00–0.04)
Basophils %: 0 % (ref 0–1)
Basophils Absolute: 0 10*3/uL (ref 0.0–0.1)
Eosinophils %: 1 % (ref 0–7)
Eosinophils Absolute: 0.1 10*3/uL (ref 0.0–0.4)
Hematocrit: 38.5 % (ref 36.6–50.3)
Hemoglobin: 12.8 g/dL (ref 12.1–17.0)
Immature Granulocytes: 0 % (ref 0–0.5)
Lymphocytes %: 19 % (ref 12–49)
Lymphocytes Absolute: 1.1 10*3/uL (ref 0.8–3.5)
MCH: 29.4 PG (ref 26.0–34.0)
MCHC: 33.2 g/dL (ref 30.0–36.5)
MCV: 88.5 FL (ref 80.0–99.0)
MPV: 11.9 FL (ref 8.9–12.9)
Monocytes %: 6 % (ref 5–13)
Monocytes Absolute: 0.4 10*3/uL (ref 0.0–1.0)
Neutrophils %: 74 % (ref 32–75)
Neutrophils Absolute: 4.1 10*3/uL (ref 1.8–8.0)
Nucleated RBCs: 0 PER 100 WBC
Platelets: 213 10*3/uL (ref 150–400)
RBC: 4.35 M/uL (ref 4.10–5.70)
RDW: 11.4 % — ABNORMAL LOW (ref 11.5–14.5)
WBC: 5.6 10*3/uL (ref 4.1–11.1)
nRBC: 0 10*3/uL (ref 0.00–0.01)

## 2021-10-29 LAB — COMPREHENSIVE METABOLIC PANEL
ALT: 43 U/L (ref 12–78)
AST: 21 U/L (ref 15–37)
Albumin/Globulin Ratio: 0.8 — ABNORMAL LOW (ref 1.1–2.2)
Albumin: 3.3 g/dL — ABNORMAL LOW (ref 3.5–5.0)
Alk Phosphatase: 80 U/L (ref 45–117)
Anion Gap: 9 mmol/L (ref 5–15)
BUN/Creatinine Ratio: 11 — ABNORMAL LOW (ref 12–20)
BUN: 17 mg/dL (ref 6–20)
CO2: 24 mmol/L (ref 21–32)
Calcium: 8.8 mg/dL (ref 8.5–10.1)
Chloride: 99 mmol/L (ref 97–108)
Creatinine: 1.5 mg/dL — ABNORMAL HIGH (ref 0.70–1.30)
Est, Glom Filt Rate: 59 mL/min/{1.73_m2} — ABNORMAL LOW (ref >60)
Globulin: 4 g/dL (ref 2.0–4.0)
Glucose: 378 mg/dL — ABNORMAL HIGH (ref 65–100)
Potassium: 4.1 mmol/L (ref 3.5–5.1)
Sodium: 132 mmol/L — ABNORMAL LOW (ref 136–145)
Total Bilirubin: 0.4 mg/dL (ref 0.2–1.0)
Total Protein: 7.3 g/dL (ref 6.4–8.2)

## 2021-10-29 LAB — TROPONIN
Troponin, High Sensitivity: 55 ng/L (ref 0–76)
Troponin, High Sensitivity: 54 ng/L (ref 0–76)

## 2021-10-29 LAB — MAGNESIUM: Magnesium: 1.7 mg/dL (ref 1.6–2.4)

## 2021-10-29 MED ORDER — INSULIN REGULAR HUMAN 100 UNIT/ML IJ SOLN
100 UNIT/ML | Freq: Once | INTRAMUSCULAR | Status: AC
Start: 2021-10-29 — End: 2021-10-29
  Administered 2021-10-29: 18:00:00 7 [IU] via INTRAVENOUS

## 2021-10-29 MED ORDER — AMLODIPINE BESYLATE 10 MG PO TABS
10 MG | ORAL | Status: AC
Start: 2021-10-29 — End: 2021-10-29
  Administered 2021-10-29: 15:00:00 10 mg via ORAL

## 2021-10-29 MED ORDER — HYDRALAZINE HCL 20 MG/ML IJ SOLN
20 MG/ML | INTRAMUSCULAR | Status: AC
Start: 2021-10-29 — End: 2021-10-29
  Administered 2021-10-29: 18:00:00 20 mg via INTRAVENOUS

## 2021-10-29 MED ORDER — HYDRALAZINE HCL 20 MG/ML IJ SOLN
20 MG/ML | INTRAMUSCULAR | Status: AC
Start: 2021-10-29 — End: 2021-10-29
  Administered 2021-10-29: 17:00:00 20 mg via INTRAVENOUS

## 2021-10-29 MED ORDER — IOPAMIDOL 76 % IV SOLN
76 % | Freq: Once | INTRAVENOUS | Status: AC | PRN
Start: 2021-10-29 — End: 2021-10-29
  Administered 2021-10-29: 22:00:00 100 mL via INTRAVENOUS

## 2021-10-29 MED ORDER — LABETALOL HCL 5 MG/ML IV SOLN
5 MG/ML | INTRAVENOUS | Status: AC
Start: 2021-10-29 — End: 2021-10-29
  Administered 2021-10-29: 15:00:00 20 mg via INTRAVENOUS

## 2021-10-29 MED ORDER — LABETALOL HCL 5 MG/ML IV SOLN
5 MG/ML | INTRAVENOUS | Status: AC
Start: 2021-10-29 — End: 2021-10-29
  Administered 2021-10-29: 17:00:00 20 mg via INTRAVENOUS

## 2021-10-29 MED ORDER — CARVEDILOL 12.5 MG PO TABS
12.5 MG | ORAL | Status: AC
Start: 2021-10-29 — End: 2021-10-29
  Administered 2021-10-29: 18:00:00 25 mg via ORAL

## 2021-10-29 MED ORDER — ASPIRIN 325 MG PO TABS
325 MG | ORAL | Status: AC
Start: 2021-10-29 — End: 2021-10-29
  Administered 2021-10-29: 15:00:00 325 mg via ORAL

## 2021-10-29 MED FILL — HUMULIN R 100 UNIT/ML IJ SOLN: 100 UNIT/ML | INTRAMUSCULAR | Qty: 7

## 2021-10-29 MED FILL — CARVEDILOL 12.5 MG PO TABS: 12.5 MG | ORAL | Qty: 2

## 2021-10-29 MED FILL — LABETALOL HCL 5 MG/ML IV SOLN: 5 MG/ML | INTRAVENOUS | Qty: 4

## 2021-10-29 MED FILL — ISOVUE-370 76 % IV SOLN: 76 % | INTRAVENOUS | Qty: 100

## 2021-10-29 MED FILL — ASPIRIN 325 MG PO TABS: 325 MG | ORAL | Qty: 1

## 2021-10-29 MED FILL — AMLODIPINE BESYLATE 10 MG PO TABS: 10 MG | ORAL | Qty: 1

## 2021-10-29 MED FILL — HYDRALAZINE HCL 20 MG/ML IJ SOLN: 20 MG/ML | INTRAMUSCULAR | Qty: 1

## 2021-10-29 NOTE — ED Notes (Addendum)
Patient resting in bed at this time respirations are even and nonlabored same observed at the bedside patient denies pain at this time     Rogers Blocker, RN  10/29/21 1428       Rogers Blocker, RN  10/29/21 1428

## 2021-10-29 NOTE — ED Notes (Signed)
Operator called for stroke alert. Overhead page did not work in ED.      [name], RN  10/29/21 [phone]

## 2021-10-29 NOTE — ED Provider Notes (Signed)
[facility] EMERGENCY DEPT  EMERGENCY DEPARTMENT ENCOUNTER      Pt Name: Tyler Escobar  MRN: [id number]  Birthdate [birth year]  Date of evaluation: 10/29/2021  Provider: Crista Luria, MD    CHIEF COMPLAINT       Chief Complaint   Patient presents with    Chest Pain         HISTORY OF PRESENT ILLNESS   (Location/Symptom, Timing/Onset, Context/Setting, Quality, Duration, Modifying Factors, Severity)  Note limiting factors.   Tyler Escobar is a 41 y.o. male who presents to the emergency department markedly elevated blood pressure transient tingling and numbness to the left upper extremity and left face which is resolved prior to arrival.    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  He had had some chest pain yesterday though none last night and this morning.  Blood pressure elevated on arrival 205/134 blood sugar at the scene was greater than 400 apparently 340 here in the ED. patient feels all symptoms have resolved and feels back to normal at this time.  No difficulty with speech or swallowing no weakness of the left upper extremity or left face    Nursing Notes were reviewed.    REVIEW OF SYSTEMS    (2-9 systems for level 4, 10 or more for level 5)     Review of Systems   Constitutional: Negative.    HENT: Negative.     Eyes: Negative.    Respiratory: Negative.     Cardiovascular:  Positive for chest pain.   Gastrointestinal: Negative.    Endocrine: Negative.    Genitourinary: Negative.    Musculoskeletal: Negative.    Neurological:  Positive for numbness. Negative for dizziness, tremors, seizures, syncope, facial asymmetry, speech difficulty, weakness, light-headedness and headaches.   Hematological: Negative.    Psychiatric/Behavioral: Negative.       Except as noted above the remainder of the review of systems was reviewed and negative.        PAST MEDICAL HISTORY     Past Medical History:   Diagnosis Date    DM (diabetes mellitus) (HCC)     HTN (hypertension)          SURGICAL HISTORY     No past surgical history on file.      CURRENT MEDICATIONS       Previous Medications    AMLODIPINE (NORVASC) 10 MG TABLET    Take 1 tablet by mouth daily    CARVEDILOL (COREG) 25 MG TABLET    Take 1 tablet by mouth 2 times daily (with meals)    METFORMIN (GLUCOPHAGE) 1000 MG TABLET    Take 1 tablet by mouth 2 times daily (with meals)       ALLERGIES     Patient has no known allergies.    FAMILY HISTORY     No family history on file.       SOCIAL HISTORY       Social History     Socioeconomic History    Marital status: Single       SCREENINGS         Glasgow Coma Scale  Eye Opening: Spontaneous  Best Verbal Response: Oriented  Best Motor Response: Obeys commands  Glasgow Coma Scale Score: 15  CIWA Assessment  BP: (!) 205/134  Pulse: (!) 105                 PHYSICAL EXAM    (up to 7 for level 4, 8 or more for level 5)     ED Triage Vitals   BP Temp Temp src Pulse Respirations SpO2 Height Weight - Scale   10/29/21 1018 10/29/21 1018 -- 10/29/21 1018 10/29/21 1018 10/29/21 1018 10/29/21 1019 10/29/21 1019   (!) 205/134 98.8 F (37.1 C)  (!) 105 18 99 % 6' (1.829 m) (!) 416 lb (188.7 kg)     Morbidly obese pleasant AA male in no immediate distress markedly elevated blood pressure  Physical Exam  Vitals and nursing note reviewed.   Constitutional:       General: He is not in acute distress.     Appearance: Normal appearance. He is obese. He is not ill-appearing or toxic-appearing.   HENT:      Head: Normocephalic and atraumatic.      Nose: Nose normal.      Mouth/Throat:      Pharynx: No oropharyngeal exudate or posterior oropharyngeal erythema.   Eyes:      Extraocular Movements: Extraocular movements intact.      Conjunctiva/sclera: Conjunctivae normal.   Cardiovascular:      Rate and Rhythm: Normal rate and regular rhythm.      Pulses: Normal  pulses.           Carotid pulses are 2+ on the right side and 2+ on the left side.       Radial pulses are 2+ on the right side and 2+ on the left side.        Dorsalis pedis pulses are 2+ on the right side and 2+ on the left side.        Posterior tibial pulses are 2+ on the right side and 2+ on the left side.      Heart sounds: Normal heart sounds. No murmur heard.  Pulmonary:      Effort: Pulmonary effort is normal. No respiratory distress.      Breath sounds: No decreased breath sounds, wheezing, rhonchi or rales.   Abdominal:      General: Abdomen is flat. Bowel sounds are normal. There is no distension.      Tenderness: There is no abdominal tenderness. There is no right CVA tenderness, left CVA tenderness, guarding or rebound.      Hernia: No hernia is present.   Musculoskeletal:         General: No swelling, tenderness, deformity or signs of injury. Normal range of motion.      Cervical back: Normal range of motion and neck supple. No rigidity or tenderness.      Right lower leg: No edema.      Left lower leg: No edema.   Skin:     General: Skin is warm and dry.      Capillary Refill: Capillary refill takes less than 2 seconds.      Findings: No erythema or rash.   Neurological:      General: No focal deficit present.      Mental Status: He is alert and oriented to person, place, and time.      Cranial Nerves: No cranial nerve deficit.      Sensory: No sensory deficit.      Motor: No weakness.      Coordination: Coordination normal.      Gait:  Gait normal.      Comments: Timing normal neurologic exam normal motor no drift speech clear normal sensation   Psychiatric:         Mood and Affect: Mood normal.         Behavior: Behavior normal.         Thought Content: Thought content normal.       DIAGNOSTIC RESULTS     EKG: All EKG's are interpreted by the Emergency Department Physician who either signs or Co-signs this chart in the absence of a cardiologist.    EKG performed at 1012 interpreted 1022 shows a  sinus tachycardia at 102 there is nonspecific ST changes in inferior leads no old EKG for comparison there is no acute injury pattern PR interval is normal QRS duration is mildly prolonged QRS axis is normal  RADIOLOGY:   Non-plain film images such as CT, Ultrasound and MRI are read by the radiologist. Plain radiographic images are visualized and preliminarily interpreted by the emergency physician with the below findings:        Interpretation per the Radiologist below, if available at the time of this note:    XR CHEST PORTABLE   Final Result   No acute process.      CT HEAD WO CONTRAST    (Results Pending)         ED BEDSIDE ULTRASOUND:   Performed by ED Physician - none    LABS:  Labs Reviewed   CBC WITH AUTO DIFFERENTIAL   COMPREHENSIVE METABOLIC PANEL   TROPONIN   TROPONIN   MAGNESIUM       All other labs were within normal range or not returned as of this dictation.    EMERGENCY DEPARTMENT COURSE and DIFFERENTIAL DIAGNOSIS/MDM:   Vitals:    Vitals:    10/29/21 1018 10/29/21 1019   BP: (!) 205/134    Pulse: (!) 105    Resp: 18    Temp: 98.8 F (37.1 C)    SpO2: 99%    Weight:  (!) 188.7 kg (416 lb)   Height:  1.829 m (6')           Medical Decision Making  Amount and/or Complexity of Data Reviewed  Labs: ordered.  Radiology: ordered.  ECG/medicine tests: ordered.    Risk  OTC drugs.  Prescription drug management.    41 year old male morbidly obese longstanding hypertension and diabetes out of blood pressure medicines x4 days arrives with markedly elevated blood pressure.  Episode of paresthesias of the left upper extremity and left face with mild left upper extremity weakness lasting less than 30 minutes resolving spontaneously clinically most likely TIA blood pressure improved here with labetalol IV and oral amlodipine.  Patient unable to fit into the CT scanner here awaiting labs we will plan on transfer to facility of higher care.    Labs show hyperglycemia mild renal insufficiency no other significant  abnormalities troponin is negative.  Patient blood pressure continue to vacillate upwards as short-term medications loss of efficacy requiring multiple doses of labetalol hydralazine I have attempted moderate reduction in diastolic to the 110-120 range.  Unable to perform imaging here at this facility due to patient's weight are attempting to transfer to facility of higher care for neuroimaging and neurologic evaluation for TIA and further management of uncontrolled hypertension.        REASSESSMENT          CRITICAL CARE TIME   Total Critical Care time was  60  minutes, excluding separately reportable procedures.  There was a high probability of clinically significant/life threatening deterioration in the patient's condition which required my urgent intervention.  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  Also with elevated glucose given single dose of IV insulin.  Unable to perform neuroimaging due to patient's weight of 416 pounds.  Blood pressure was modestly decreased with IV labetalol and hydralazine IV oral amlodipine.  Given single ASA here  CONSULTS: Dr. Linward [facility]ician for further evaluation neuroimaging and neurology evaluation for TIA and uncontrolled hypertension  None    PROCEDURES:  Unless otherwise noted below, none     Procedures    FINAL IMPRESSION    No diagnosis found.      DISPOSITION/PLAN   DISPOSITION        PATIENT REFERRED TO:  No follow-up provider specified.    DISCHARGE MEDICATIONS:  New Prescriptions    No medications on file     Controlled Substances Monitoring:     No flowsheet data found.    (Please note that portions of this note were completed with a voice recognition program.  Efforts were made to edit the dictations but occasionally words are mis-transcribed.)    Crista Luria, MD (electronically  signed)  Attending Emergency Physician           Addison Lank, MD  10/29/21 [phone]

## 2021-10-29 NOTE — ED Triage Notes (Signed)
Pt reports Chest pain that started at 0915. PT reports he also felt weak on left side. Pt reports symptoms have resolved

## 2021-10-29 NOTE — ED Notes (Signed)
MD notified about possible stroke alert as overhead paging did not work. MD stated that we were not calling alert for PT that this was a TIA and PT is back at baseline.     [name], RN  10/29/21 [phone]

## 2021-10-29 NOTE — ED Notes (Signed)
Called [facility] and spoke to [place] to inform them that I was tubing report on this patient.      Elliot Cousin, RN  10/29/21 2216

## 2021-10-29 NOTE — ED Triage Notes (Signed)
PT transferred from [facility]. PT reported 0915 PT had flutter to chest and left side went numb and heavy that lasted for 10-15 minutes. Reports that EMS said his blood pressure was high. PT reporting that symptoms have stopped but has Headache 6/10. FAST neg in triage but reporting some decreased sensation to right shoulder.

## 2021-10-29 NOTE — ED Notes (Signed)
Tyler Escobar Sister [phone]     [name], RN  10/29/21 1554

## 2021-10-29 NOTE — ED Provider Notes (Signed)
[facility]  EMERGENCY DEPARTMENT HISTORY AND PHYSICAL EXAM      Date: 10/29/2021  Patient Name: Tyler Escobar  MRN: [id number]  [place]: October 25, 1980  Date of evaluation: 10/29/2021  Provider: Vernell Morgans, MD   Note Started: 4:01 PM EDT 10/29/21    HISTORY OF PRESENT ILLNESS     Chief Complaint   Patient presents with    Transient Ischemic Attack       History Provided By: Patient, ems    HPI: Gerado Nabers is a 41 y.o. male with a history of hypertension and diabetes presenting with left-sided numbness as a transfer from [place].  According to patient and EMS, around 915 this morning patient started having left-sided numbness and tingling as well as a heaviness and weak feeling.  Lasted for about 10 to 15 minutes and then it got better.  By the time he went to [facility], continued to have some numbness in the left shoulder and left arm so was stroke alerted.  They did labs on him but unfortunately his shoulders were too broad for the CT machine.  It was sent here for CT and CTA.  Patient states that now he feels back to baseline.  Maybe has just a little bit of numbness over the deltoid but otherwise back to baseline. Labs all nl there    PAST MEDICAL HISTORY   Past Medical History:  Past Medical History:   Diagnosis Date    DM (diabetes mellitus) ([place])     HTN (hypertension)        Past Surgical History:  No past surgical history on file.    Family History:  No family history on file.    Social History:       Allergies:  No Known Allergies    PCP: Kirby Crigler, DO    Current Meds:   No current facility-administered medications for this encounter.     Current Outpatient Medications   Medication Sig Dispense Refill    amLODIPine (NORVASC) 10 MG tablet Take 1 tablet by mouth daily      metFORMIN (GLUCOPHAGE) 1000 MG tablet Take 1 tablet by mouth 2 times daily (with meals)      carvedilol (COREG) 25 MG tablet Take 1 tablet by mouth 2 times daily (with  meals)         Social Determinants of Health:   Social Determinants of Health     Tobacco Use: Not on file   Alcohol Use: Not on file   Financial Resource Strain: Not on file   Food Insecurity: Not on file   Transportation Needs: Not on file   Physical Activity: Not on file   Stress: Not on file   Social Connections: Not on file   Intimate Partner Violence: Not on file   Depression: Not on file   Housing Stability: Not on file       PHYSICAL EXAM   Physical Exam  Vitals and nursing note reviewed.   Constitutional:       Appearance: Normal appearance.   HENT:      Head: Normocephalic and atraumatic.      Nose: Nose normal.      Mouth/Throat:      Mouth: Mucous membranes are moist.   Eyes:      Extraocular Movements: Extraocular movements intact.      Conjunctiva/sclera: Conjunctivae normal.   Cardiovascular:      Rate and Rhythm: Normal rate and regular rhythm.  Pulmonary:      Effort: Pulmonary effort is normal. No respiratory distress.   Abdominal:      General: Abdomen is flat. There is no distension.      Tenderness: There is no abdominal tenderness.   Musculoskeletal:         General: Normal range of motion.      Cervical back: Normal range of motion.   Skin:     General: Skin is warm.   Neurological:      General: No focal deficit present.      Mental Status: He is alert. Mental status is at baseline.      Cranial Nerves: No cranial nerve deficit.      Sensory: Sensory deficit present.      Motor: No weakness.      Coordination: Coordination normal.      Comments: Very minimal sensory deficit over just the left shoulder.   Psychiatric:         Mood and Affect: Mood normal.         SCREENINGS     NIH Stroke Scale  Interval: Baseline  Level of Consciousness (1a): Alert  LOC Questions (1b): Answers both correctly  LOC Commands (1c): Performs both tasks correctly  Best Gaze (2): Normal  Visual (3): No visual loss  Facial Palsy (4): Normal symmetrical movement  Motor Arm, Left (5a): No drift  Motor Arm, Right (5b):  No drift  Motor Leg, Left (6a): No drift  Motor Leg, Right (6b): No drift  Limb Ataxia (7): Absent  Sensory (8): (!) Mild to Moderate  Best Language (9): No aphasia  Dysarthria (10): Normal  Extinction and Inattention (11): No abnormality  Total: 1        LAB, EKG AND DIAGNOSTIC RESULTS   Labs:  Recent Results (from the past 12 hour(s))   EKG 12 Lead    Collection Time: 10/29/21 10:12 AM   Result Value Ref Range    Ventricular Rate 102 BPM    Atrial Rate 103 BPM    P-R Interval 153 ms    QRS Duration 96 ms    Q-T Interval 349 ms    QTc Calculation (Bazett) 455 ms    P Axis 45 degrees    R Axis 53 degrees    T Axis -1 degrees    Diagnosis       Sinus tachycardia  RSR' in V1 or V2, probably normal variant  Minimal ST depression, inferior leads  Borderline ST elevation, anterior leads     CBC with Auto Differential    Collection Time: 10/29/21 12:05 PM   Result Value Ref Range    WBC 5.6 4.1 - 11.1 K/uL    RBC 4.35 4.10 - 5.70 M/uL    Hemoglobin 12.8 12.1 - 17.0 g/dL    Hematocrit 38.5 36.6 - 50.3 %    MCV 88.5 80.0 - 99.0 FL    MCH 29.4 26.0 - 34.0 PG    MCHC 33.2 30.0 - 36.5 g/dL    RDW 11.4 (L) 11.5 - 14.5 %    Platelets 213 150 - 400 K/uL    MPV 11.9 8.9 - 12.9 FL    Nucleated RBCs 0.0 0.0 PER 100 WBC    nRBC 0.00 0.00 - 0.01 K/uL    Neutrophils % 74 32 - 75 %    Lymphocytes % 19 12 - 49 %    Monocytes % 6 5 - 13 %    Eosinophils % 1  0 - 7 %    Basophils % 0 0 - 1 %    Immature Granulocytes 0 0 - 0.5 %    Neutrophils Absolute 4.1 1.8 - 8.0 K/UL    Lymphocytes Absolute 1.1 0.8 - 3.5 K/UL    Monocytes Absolute 0.4 0.0 - 1.0 K/UL    Eosinophils Absolute 0.1 0.0 - 0.4 K/UL    Basophils Absolute 0.0 0.0 - 0.1 K/UL    Absolute Immature Granulocyte 0.0 0.00 - 0.04 K/UL    Differential Type AUTOMATED     Troponin    Collection Time: 10/29/21 12:05 PM   Result Value Ref Range    Troponin, High Sensitivity 55 0 - 76 ng/L   Magnesium    Collection Time: 10/29/21 12:05 PM   Result Value Ref Range    Magnesium 1.7 1.6 - 2.4  mg/dL   Comprehensive Metabolic Panel    Collection Time: 10/29/21 12:05 PM   Result Value Ref Range    Sodium 132 (L) 136 - 145 mmol/L    Potassium 4.1 3.5 - 5.1 mmol/L    Chloride 99 97 - 108 mmol/L    CO2 24 21 - 32 mmol/L    Anion Gap 9 5 - 15 mmol/L    Glucose 378 (H) 65 - 100 mg/dL    BUN 17 6 - 20 mg/dL    Creatinine 1.50 (H) 0.70 - 1.30 mg/dL    Bun/Cre Ratio 11 (L) 12 - 20      Est, Glom Filt Rate 59 (L) >60 ml/min/1.77m    Calcium 8.8 8.5 - 10.1 mg/dL    Total Bilirubin 0.4 0.2 - 1.0 mg/dL    AST 21 15 - 37 U/L    ALT 43 12 - 78 U/L    Alk Phosphatase 80 45 - 117 U/L    Total Protein 7.3 6.4 - 8.2 g/dL    Albumin 3.3 (L) 3.5 - 5.0 g/dL    Globulin 4.0 2.0 - 4.0 g/dL    Albumin/Globulin Ratio 0.8 (L) 1.1 - 2.2     Troponin    Collection Time: 10/29/21  2:24 PM   Result Value Ref Range    Troponin, High Sensitivity 54 0 - 76 ng/L       EKG: Initial EKG interpreted by me.Not Applicable    Radiologic Studies:  Non-plain film images such as CT, Ultrasound and MRI are read by the radiologist. Plain radiographic images are visualized and preliminarily interpreted by the ED Physician with the following findings: Not Applicable.    Interpretation per the Radiologist below, if available at the time of this note:  CT Head W/O Contrast   Final Result   No acute findings.            CTA HEAD NECK W CONTRAST   Final Result   1. No acute vascular abnormality. No large vessel occlusion.   2. 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   3. Complete opacification of the left sphenoidal sinus.               ED COURSE and DIFFERENTIAL DIAGNOSIS/MDM   CC/HPI Summary, DDx, ED Course, and Reassessment: [facility].  No indication for stroke alert at this time given that he does not have any debilitating deficits necessitating TNKase is out of the window or LVO signs.  Plan will be to still get CT and CTA to evaluate for signs of a stroke and given  his risk  factors admit for MRI and work-up    Records Reviewed (source and summary of external notes): Prior medical records and Nursing notes    Vitals:    Vitals:    10/29/21 1600 10/29/21 1610 10/29/21 1640 10/29/21 1700   BP: (!) 159/75 138/65 134/66 109/86   Pulse: 85 83 85 79   Resp: 14 12 24 19    Temp:       TempSrc:       SpO2: 100% 100% 98% 99%   Weight:       Height:            ED COURSE  ED Course as of 10/29/21 1844   Fri Oct 29, 2021   1843 Patient CTA shows a stenosis of the P3 segment.  Patient continues to be asymptomatic at this point.  States that he is not experiencing any numbness of the left arm at this point. [JS]      ED Course User Index  [JS] Vernell Morgans, MD       Disposition Considerations (Tests not done, Shared Decision Making, Pt Expectation of Test or Treatment.): Not Applicable    Patient was given the following medications:  Medications   iopamidol (ISOVUE-370) 76 % injection 100 mL (100 mLs IntraVENous Given 10/29/21 1743)       CONSULTS: (Who and What was discussed)  None     Social Determinants affecting Dx or Tx: None    Smoking Cessation: Not Applicable    PROCEDURES   Unless otherwise noted above, none.  Procedures      CRITICAL CARE TIME   Patient does not meet Critical Care Time, 0 minutes    FINAL IMPRESSION     1. TIA (transient ischemic attack)          DISPOSITION/PLAN   DISPOSITION Decision To Admit 10/29/2021 06:42:36 PM    Admit Note: Pt is being admitted by Cha. The results of their tests and reason(s) for their admission have been discussed with pt and/or available family. They convey agreement and understanding for the need to be admitted and for the admission diagnosis.     PATIENT REFERRED TO:  No follow-up provider specified.      DISCHARGE MEDICATIONS:     Medication List        ASK your doctor about these medications      amLODIPine 10 MG tablet  Commonly known as: NORVASC     carvedilol 25 MG tablet  Commonly known as: COREG     metFORMIN 1000 MG tablet  Commonly  known as: GLUCOPHAGE                DISCONTINUED MEDICATIONS:  Current Discharge Medication List          I am the Primary Clinician of Record: Vernell Morgans, MD (electronically signed)    (Please note that parts of this dictation were completed with voice recognition software. Quite often unanticipated grammatical, syntax, homophones, and other interpretive errors are inadvertently transcribed by the computer software. Please disregards these errors. Please excuse any errors that have escaped final proofreading.)     Vernell Morgans, MD  10/29/21 1844

## 2021-10-29 NOTE — H&P (Addendum)
History and Physical    Patient: Tyler Escobar MRN: [id number]  SSN: [id number]    Date of Birth: 21-May-1980  Age: 41 y.o.  Sex: male      Subjective:      Zaron Voeltz is a 41 y.o. male with PMH of diabetes, hypertension presented to the ED with chief complaint of left face, arm and leg numbness. He was driving this morning, and at about 9:15am he started noticing fluttering of his chest followed by numbness of the left side of his body. Also associated with left leg and arm weakness. He did not notice facial droop, slurred speech, vertigo or vision loss.  He presented to outside ED but unable to undergo CT scan there due to body habitus. He is then transferred to our facility. By th time he arrived to our facility, most of his symptoms resolved and only have some residue numbness of his left shoulder. In addition, he also was told to have irregular rhythm before, but unable to specify if it is atrial fibrillation and was never prescribed AC.    In the ED, hypertensive. CTA showed right P3 segment stenosis but no large vessels occlusion. No carotid artery stenosis bilaterally. CT head no acute findings.     Case discussed with ED physician and agree that patient require inpatient admission/ observation for further management.     Chart review: none    Past Medical History:   Diagnosis Date    DM (diabetes mellitus) (HCC)     HTN (hypertension)      No family history on file.  Social History     Tobacco Use    Smoking status: Not on file    Smokeless tobacco: Not on file   Substance Use Topics    Alcohol use: Not on file        Objective:     Physical Exam:   General: alert, cooperative, no distress  Eye: conjunctivae/corneas clear. PERRL, EOM's intact.   Throat and Neck: normal and no erythema or exudates noted. No mass   Lung: clear to auscultation bilaterally  Heart: regular rate and rhythm,   Abdomen: soft, non-tender. Bowel sounds normal. No masses,  Extremities: No LE edema. able to move all extremities  normal, atraumatic  Skin: Normal.  Neurologic: AOx3. CN 2-12 intact. Motor function 5/5 in all major muscle groups.  Sensation grossly intact bilaterally.   Psychiatric: non focal    Most recent lab work and imaging results reviewed in EMR.     Assessment and plan:   # Suspect TIA  - Consult neurology  - MRI brain  - EKG, telemetry and ECHO ordered.  - Lipid profile  - Start HI statin PO.  - Permissive hypertension overnight, keep BP < 220/110  - PT/OT/ST    # Chest palpitation  - given palpitation prior to stroke like symptoms, would monitor patient on telemetry.   - also consult cardiology to evaluate need for home monitor on discharge.     # Essential hypertension   - Continue home medications.      # Diabetes  - Hold home medications   - POC + correctional insulin   - Check HbA1c    # Social Determents of health: None    # Full code as default. Need further clarification.     # Medication reconciliation though EMR and/or outside facility documentation. , However, medication reconciliation incomplete. Appreciate nursing/pharmacy's assistance     Signed By: Theotis Barrio, MD  October 29, 2021

## 2021-10-30 LAB — POCT GLUCOSE
POC Glucose: 314 mg/dL — ABNORMAL HIGH (ref 65–100)
POC Glucose: 272 mg/dL — ABNORMAL HIGH (ref 65–100)
POC Glucose: 263 mg/dL — ABNORMAL HIGH (ref 65–100)
POC Glucose: 279 mg/dL — ABNORMAL HIGH (ref 65–100)
POC Glucose: 325 mg/dL — ABNORMAL HIGH (ref 65–100)

## 2021-10-30 LAB — BASIC METABOLIC PANEL W/ REFLEX TO MG FOR LOW K
Anion Gap: 5 mmol/L (ref 5–15)
BUN: 17 mg/dL (ref 6–20)
Bun/Cre Ratio: 11 — ABNORMAL LOW (ref 12–20)
CO2: 26 mmol/L (ref 21–32)
Calcium: 9 mg/dL (ref 8.5–10.1)
Chloride: 104 mmol/L (ref 97–108)
Creatinine: 1.59 mg/dL — ABNORMAL HIGH (ref 0.70–1.30)
Est, Glom Filt Rate: 55 mL/min/{1.73_m2} — ABNORMAL LOW (ref >60)
Glucose: 330 mg/dL — ABNORMAL HIGH (ref 65–100)
Potassium: 4.1 mmol/L (ref 3.5–5.1)
Sodium: 135 mmol/L — ABNORMAL LOW (ref 136–145)

## 2021-10-30 LAB — CBC WITH AUTO DIFFERENTIAL
Absolute Immature Granulocyte: 0 10*3/uL (ref 0.00–0.04)
Basophils %: 0 % (ref 0–1)
Basophils Absolute: 0 10*3/uL (ref 0.0–0.1)
Eosinophils %: 2 % (ref 0–7)
Eosinophils Absolute: 0.1 10*3/uL (ref 0.0–0.4)
Hematocrit: 38.9 % (ref 36.6–50.3)
Hemoglobin: 12.7 g/dL (ref 12.1–17.0)
Immature Granulocytes: 0 % (ref 0–0.5)
Lymphocytes %: 30 % (ref 12–49)
Lymphocytes Absolute: 1.6 10*3/uL (ref 0.8–3.5)
MCH: 29.9 PG (ref 26.0–34.0)
MCHC: 32.6 g/dL (ref 30.0–36.5)
MCV: 91.5 FL (ref 80.0–99.0)
MPV: 11.8 FL (ref 8.9–12.9)
Monocytes %: 8 % (ref 5–13)
Monocytes Absolute: 0.4 10*3/uL (ref 0.0–1.0)
Neutrophils %: 60 % (ref 32–75)
Neutrophils Absolute: 3.2 10*3/uL (ref 1.8–8.0)
Nucleated RBCs: 0 PER 100 WBC
Platelets: 226 10*3/uL (ref 150–400)
RBC: 4.25 M/uL (ref 4.10–5.70)
RDW: 11.5 % (ref 11.5–14.5)
WBC: 5.3 10*3/uL (ref 4.1–11.1)
nRBC: 0 10*3/uL (ref 0.00–0.01)

## 2021-10-30 LAB — HEMOGLOBIN A1C
Hemoglobin A1C: 11.1 % — ABNORMAL HIGH (ref 4.0–5.6)
eAG: 272 mg/dL

## 2021-10-30 MED ORDER — ACETAMINOPHEN 325 MG PO TABS
325 | Freq: Four times a day (QID) | ORAL | Status: DC | PRN
Start: 2021-10-30 — End: 2021-11-02

## 2021-10-30 MED ORDER — CARVEDILOL 12.5 MG PO TABS
12.5 MG | Freq: Two times a day (BID) | ORAL | Status: AC
Start: 2021-10-30 — End: 2021-11-02
  Administered 2021-10-30 – 2021-11-02 (×8): 25 mg via ORAL

## 2021-10-30 MED ORDER — POLYETHYLENE GLYCOL 3350 17 G PO PACK
17 g | Freq: Every day | ORAL | Status: AC | PRN
Start: 2021-10-30 — End: 2021-11-02

## 2021-10-30 MED ORDER — NORMAL SALINE FLUSH 0.9 % IV SOLN
0.9 % | Freq: Two times a day (BID) | INTRAVENOUS | Status: AC
Start: 2021-10-30 — End: 2021-11-02
  Administered 2021-10-30 – 2021-11-02 (×8): 10 mL via INTRAVENOUS

## 2021-10-30 MED ORDER — DEXTROSE 10 % IV SOLN
10 % | INTRAVENOUS | Status: DC | PRN
Start: 2021-10-30 — End: 2021-11-02

## 2021-10-30 MED ORDER — GLUCOSE 4 G PO CHEW
4 g | ORAL | Status: AC | PRN
Start: 2021-10-30 — End: 2021-11-02

## 2021-10-30 MED ORDER — INSULIN GLARGINE 100 UNIT/ML SC SOLN
100 | Freq: Every day | SUBCUTANEOUS | Status: DC
Start: 2021-10-30 — End: 2021-10-31
  Administered 2021-10-30: 22:00:00 10 [IU] via SUBCUTANEOUS

## 2021-10-30 MED ORDER — HYDRALAZINE HCL 50 MG PO TABS
50 | Freq: Three times a day (TID) | ORAL | Status: DC
Start: 2021-10-30 — End: 2021-10-31
  Administered 2021-10-30 – 2021-10-31 (×2): 25 mg via ORAL

## 2021-10-30 MED ORDER — INSULIN LISPRO 100 UNIT/ML IJ SOLN
100 UNIT/ML | Freq: Three times a day (TID) | INTRAMUSCULAR | Status: AC
Start: 2021-10-30 — End: 2021-11-02
  Administered 2021-10-30: 16:00:00 4 [IU] via SUBCUTANEOUS
  Administered 2021-10-30: 21:00:00 6 [IU] via SUBCUTANEOUS
  Administered 2021-10-30: 14:00:00 4 [IU] via SUBCUTANEOUS
  Administered 2021-10-31: 13:00:00 2 [IU] via SUBCUTANEOUS
  Administered 2021-10-31: 21:00:00 6 [IU] via SUBCUTANEOUS
  Administered 2021-10-31 – 2021-11-02 (×5): 2 [IU] via SUBCUTANEOUS

## 2021-10-30 MED ORDER — DEXTROSE 10 % IV BOLUS
INTRAVENOUS | Status: AC | PRN
Start: 2021-10-30 — End: 2021-11-02

## 2021-10-30 MED ORDER — GLUCAGON (RDNA) 1 MG IJ KIT
1 MG | INTRAMUSCULAR | Status: AC | PRN
Start: 2021-10-30 — End: 2021-11-02

## 2021-10-30 MED ORDER — ACETAMINOPHEN 650 MG RE SUPP
650 | Freq: Four times a day (QID) | RECTAL | Status: DC | PRN
Start: 2021-10-30 — End: 2021-11-02

## 2021-10-30 MED ORDER — ONDANSETRON HCL 4 MG/2ML IJ SOLN
4 MG/2ML | Freq: Four times a day (QID) | INTRAMUSCULAR | Status: AC | PRN
Start: 2021-10-30 — End: 2021-11-02

## 2021-10-30 MED ORDER — INSULIN LISPRO 100 UNIT/ML IJ SOLN
100 UNIT/ML | Freq: Every evening | INTRAMUSCULAR | Status: AC
Start: 2021-10-30 — End: 2021-11-02
  Administered 2021-10-30 – 2021-11-02 (×3): 4 [IU] via SUBCUTANEOUS

## 2021-10-30 MED ORDER — INSULIN GLARGINE 100 UNIT/ML SC SOLN
100 UNIT/ML | Freq: Every evening | SUBCUTANEOUS | Status: DC
Start: 2021-10-30 — End: 2021-10-30

## 2021-10-30 MED ORDER — ATORVASTATIN CALCIUM 40 MG PO TABS
40 MG | Freq: Every evening | ORAL | Status: AC
Start: 2021-10-30 — End: 2021-11-02
  Administered 2021-10-30 – 2021-11-02 (×4): 80 mg via ORAL

## 2021-10-30 MED ORDER — ONDANSETRON 4 MG PO TBDP
4 MG | Freq: Three times a day (TID) | ORAL | Status: AC | PRN
Start: 2021-10-30 — End: 2021-11-02

## 2021-10-30 MED ORDER — SODIUM CHLORIDE 0.9 % IV SOLN
0.9 % | INTRAVENOUS | Status: DC
Start: 2021-10-30 — End: 2021-10-31
  Administered 2021-10-30: 22:00:00 via INTRAVENOUS

## 2021-10-30 MED ORDER — SODIUM CHLORIDE 0.9 % IV SOLN
0.9 % | INTRAVENOUS | Status: AC | PRN
Start: 2021-10-30 — End: 2021-11-02

## 2021-10-30 MED ORDER — ENOXAPARIN SODIUM 60 MG/0.6ML IJ SOSY
60 MG/0.6ML | Freq: Two times a day (BID) | INTRAMUSCULAR | Status: AC
Start: 2021-10-30 — End: 2021-11-02
  Administered 2021-10-30 – 2021-11-02 (×8): 60 mg via SUBCUTANEOUS

## 2021-10-30 MED ORDER — NORMAL SALINE FLUSH 0.9 % IV SOLN
0.9 % | INTRAVENOUS | Status: AC | PRN
Start: 2021-10-30 — End: 2021-11-02

## 2021-10-30 MED ORDER — ASPIRIN 81 MG PO TBEC
81 | Freq: Every day | ORAL | Status: DC
Start: 2021-10-30 — End: 2021-11-02
  Administered 2021-10-30 – 2021-11-02 (×4): 81 mg via ORAL

## 2021-10-30 MED ORDER — AMLODIPINE BESYLATE 5 MG PO TABS
5 MG | Freq: Every day | ORAL | Status: AC
Start: 2021-10-30 — End: 2021-11-02
  Administered 2021-10-30 – 2021-11-02 (×4): 10 mg via ORAL

## 2021-10-30 MED FILL — HYDRALAZINE HCL 50 MG PO TABS: 50 MG | ORAL | Qty: 1

## 2021-10-30 MED FILL — HUMALOG 100 UNIT/ML IJ SOLN: 100 UNIT/ML | INTRAMUSCULAR | Qty: 4

## 2021-10-30 MED FILL — LOVENOX 60 MG/0.6ML IJ SOSY: 60 MG/0.6ML | INTRAMUSCULAR | Qty: 0.6

## 2021-10-30 MED FILL — ATORVASTATIN CALCIUM 40 MG PO TABS: 40 MG | ORAL | Qty: 2

## 2021-10-30 MED FILL — SODIUM CHLORIDE 0.9 % IV SOLN: 0.9 % | INTRAVENOUS | Qty: 1000

## 2021-10-30 MED FILL — HUMALOG 100 UNIT/ML IJ SOLN: 100 UNIT/ML | INTRAMUSCULAR | Qty: 6

## 2021-10-30 MED FILL — ASPIRIN LOW DOSE 81 MG PO TBEC: 81 MG | ORAL | Qty: 1

## 2021-10-30 MED FILL — DEXTROSE 10 % IV SOLN: 10 % | INTRAVENOUS | Qty: 1000

## 2021-10-30 MED FILL — SODIUM CHLORIDE FLUSH 0.9 % IV SOLN: 0.9 % | INTRAVENOUS | Qty: 40

## 2021-10-30 MED FILL — CARVEDILOL 12.5 MG PO TABS: 12.5 MG | ORAL | Qty: 2

## 2021-10-30 MED FILL — LANTUS 100 UNIT/ML SC SOLN: 100 UNIT/ML | SUBCUTANEOUS | Qty: 10

## 2021-10-30 MED FILL — AMLODIPINE BESYLATE 5 MG PO TABS: 5 MG | ORAL | Qty: 2

## 2021-10-30 NOTE — Progress Notes (Signed)
Speech LAnguage Pathology Dysphagia EVALUATION    Patient: Tyler Escobar (41 y.o. male)  Date: 10/30/2021  Primary Diagnosis: TIA (transient ischemic attack) [G45.9]  Suspected cerebrovascular accident (CVA) [R09.89]       Precautions:     ASSESSMENT :  Based on the objective data described below, the patient presents with largely [facility] swallow function. Patient with timely and efficient swallow of all consistencies. No oral residue. Pharyngeal phase appears largely [facility]. HLE adequate to palpation. Patient tolerated single and consecutive cup sips thin liquid, puree, and regular with no overt s/s of pen/aspiration. He maintained a clear vocal quality following all trials. Patient reports no concerns regarding his speech/language     Patient will be discharged from skilled speech-language pathology services at this time.     PLAN :  Recommendations and Planned Interventions:  Rec continue regular, thin liquid diet. Adhere to aspiration precautions    Acute SLP Services: No, patient will be discharged from acute skilled speech-language pathology at this time.    Discharge Recommendations: To Be Determined     SUBJECTIVE:   Patient is agreeable to beginning eval. Patient's significant other present during eval, with patient consent     OBJECTIVE:     Past Medical History:   Diagnosis Date    DM (diabetes mellitus) (HCC)     HTN (hypertension)    History reviewed. No pertinent surgical history.    Prior Level of Function/Home Situation:   Social/Functional History  Lives With: Significant other  Type of Home: House  Home Layout: One level  Home Access: Stairs to enter with rails  Entrance Stairs - Number of Steps: 5  Entrance Stairs - Rails: Both  ADL Assistance: Independent  Ambulation Assistance: Independent  Transfer Assistance: Independent  Active Driver: Yes    Cognitive and Communication Status:  Neurologic State: Alert  Orientation Level: Oriented x4  Cognition: Follows commands    Baseline Assessment:  Hearing:  Adequate    General:   O2 Device: None (Room air)    Dysphagia:  Oral Assessment:  Oral Motor   Labial: No impairment  Dentition: Natural;Full  Oral Hygiene: Other  Oral Hygiene Comments: Adequate  Lingual: No impairment    P.O. Trials:  Pharyngeal Phase   Pharyngeal Phase: WNL      Voice:  WNL    After Treatment:  Patient left in no apparent distress in bed, Call bell left within reach, Nursing notified, and Caregiver present     Pain:  VAS (numerical) 0    COMMUNICATION/EDUCATION:   Swallow safety precautions and Diet recommendations education provided to Patient and SO/spouse via explanation, all questions/concerns addressed. Patient verbalizes understanding.    The patient's plan of care including recommendations, planned interventions, and recommended diet changes were discussed with: Registered nurse and Physician.    Thank you,  Rosanne Ashing, M.S. CCC-SLP  Minutes: 11

## 2021-10-30 NOTE — Plan of Care (Signed)
PHYSICAL THERAPY EVALUATION AND DISCHARGE  Patient: Tyler Escobar (41 y.o. male)  Date: 10/30/2021  Primary Diagnosis: TIA (transient ischemic attack) [G45.9]  Suspected cerebrovascular accident (CVA) [R09.89]       Precautions:                  In place during session:     ASSESSMENT  Pt is a 41 y.o. male admitted on 10/29/2021 for L UE numbness/rule out CVA presents to PT and at this time pt does not indicate the need for continued skilled acute PT.  Pt supine in bed upon PT arrival, agreeable to evaluation. Pt A&O x 4.  Initial head CT neg for acute infarct.  Pt reports he has been getting up to and from bathroom ad lib without difficulty.    Based on the objective data described below pt currently present at baseline independence for transfers and mobility at this time. (See below for objective details and assist levels).     Overall pt tolerated session good today with overall SBA/mod I with bed mob and transfers.  Pt able to amb in room approx 54ft without difficulty or LOB and normal gt pattern.  Pt was assisted to and from bathroom and was I with toileting needs.      Pt has no skilled acute PT needs at this time noted by PT or reported by pt, will DC skilled PT following evaluation; pt verbalized understanding and agreement. Current PT DC recommendation Home Self Care once medically appropriate.         PLAN :  Recommendations and Planned Interventions: DC from skilled PT services following evaluation.     Recommend for staff: Out of bed to chair for meals    Rationale for discharge:  Patient currently at functional baseline for transfers/mobility, no further skilled therapy required at this time    Frequency/Duration: Patient will be followed by physical therapy: DC from services following evaluation due to pt being at functional baseline; no skilled therapy required during admission, please reorder if needed     Recommendation for discharge: (in order for the patient to meet his/her long term  goals)  Home Self Care           SUBJECTIVE:   Patient supine in bed upon PT arrival, agreeable to work with PT    OBJECTIVE DATA SUMMARY:     Past Medical History:   Diagnosis Date    DM (diabetes mellitus) (HCC)     HTN (hypertension)    History reviewed. No pertinent surgical history.    Home Situation:  Social/Functional History  Lives With: Significant other  Type of Home: House  Home Layout: One level  Home Access: Stairs to enter with rails  Entrance Stairs - Number of Steps: 5  Entrance Stairs - Rails: Both  ADL Assistance: Independent  Ambulation Assistance: Independent  Transfer Assistance: Independent  Active Driver: Yes  Critical Behavior:  Orientation  Overall Orientation Status: Within Normal Limits  Orientation Level: Oriented X4       Hearing:   Hearing  Hearing: Within functional limits    Vision/Perceptual:          Vision  Vision: Within Functional Limits       Strength:    Strength: Within functional limits    Range Of Motion:  AROM: Within functional limits       Tone & Sensation:      Sensation: Intact    Coordination:  Coordination: Within functional limits  Functional Mobility:  Bed Mobility:     Bed Mobility Training  Bed Mobility Training: Yes  Overall Level of Assistance: Stand-by assistance  Rolling: Stand-by assistance  Supine to Sit: Stand-by assistance  Sit to Supine: Stand-by assistance  Scooting: Stand-by assistance  Transfers:     Art therapist: Yes  Overall Level of Assistance: Stand-by assistance  Stand to Sit: Stand-by assistance  Stand Pivot Transfers: Stand-by assistance  Bed to Chair: Stand-by assistance  Balance:     Balance  Sitting: Intact  Standing: Intact    Ambulation/Gait Training:    Gait  Overall Level of Assistance: Stand-by assistance  Distance (ft): 45 Feet  Assistive Device: Gait belt         Functional Measure:  [employer] AM-PACT "6 Clicks"         Basic Mobility Inpatient Short Form  How much difficulty does the patient  currently have... Unable A Lot A Little None   1.  Turning over in bed (including adjusting bedclothes, sheets and blankets)?   []  1   []  2   []  3   [x]  4   2.  Sitting down on and standing up from a chair with arms ( e.g., wheelchair, bedside commode, etc.)   []  1   []  2   []  3   [x]  4   3.  Moving from lying on back to sitting on the side of the bed?   []  1   []  2   []  3   [x]  4          How much help from another person does the patient currently need... Total A Lot A Little None   4.  Moving to and from a bed to a chair (including a wheelchair)?   []  1   []  2   []  3   [x]  4   5.  Need to walk in hospital room?   []  1   []  2   []  3   [x]  4   6.  Climbing 3-5 steps with a railing?   []  1   []  2   []  3   [x]  4    2007, Trustees of , under license to [employer], [place]. All rights reserved     Score:  Initial: 24/24 Most Recent: X (Date: 10/30/2021)   Interpretation of Tool:  Represents activities that are increasingly more difficult (i.e. Bed mobility, Transfers, Gait).  Score 24 23 22-20 19-15 14-10 9-7 6   Modifier CH CI CJ CK CL CM CN          Physical Therapy Evaluation Charge Determination   History Examination Presentation Decision-Making   LOW Complexity : Zero comorbidities / personal factors that will impact the outcome / POC LOW Complexity : 1-2 Standardized tests and measures addressing body structure, function, activity limitation and / or participation in recreation  LOW Complexity : Stable, uncomplicated  Outcome measure: AMPAC 6: LOW      Based on the above components, the patient evaluation is determined to be of the following complexity level: LOW          Pain Rating:  0/10   Pain Intervention(s):       Activity Tolerance:   Good    After treatment patient left in no apparent distress:   Bed locked and in lowest position Patient left in no apparent distress in bed, Call bell within reach, and Caregiver / family present and nsg updated.  COMMUNICATION/EDUCATION:   The patient's plan of  care was discussed with: Registered nurse    Patient Education  Education Given To: Patient  Education Provided: Role of Therapy  Education Method: Verbal  Education Outcome: Verbalized understanding            Thank you for this referral.  Sherle Poe, PT  Minutes: 20

## 2021-10-30 NOTE — Progress Notes (Signed)
OT eval order received and acknowledged. Pt screened and per PT and pt, pt is demonstrating baseline independence for self care tasks and functional mobility/transfers. Pt reports no need for skilled OT services at this time. OT evaluation order will therefore be discontinued as pt has no acute OT needs. Please re-order OT if pt functional status changes. Thank you.

## 2021-10-30 NOTE — Consults (Signed)
Consult    NAME: Tyler Escobar   DOB:  May 03, 1981   MRN:  [id number]     Date/Time:  10/30/2021 3:36 PM    Patient PCP: Thelma Barge, DO  ________________________________________________________________________      Subjective:   CHIEF COMPLAINT: Weakness of the left side.    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  In some time it got worst and he became weak on the left extremities.  He had some numbness of the face on the left side but denied any speech problem.  He has a history of diabetes and sleep apnea and morbid exogenous obesity and presented to hospital to evaluate for possible TIA versus stroke.  He denied any chest tightness however.           Past Medical History:   Diagnosis Date    DM (diabetes mellitus) (HCC)     HTN (hypertension)       History reviewed. No pertinent surgical history.  No Known Allergies   Meds:  See below  Social History     Tobacco Use    Smoking status: Former     Types: Cigarettes    Smokeless tobacco: Former   Substance Use Topics    Alcohol use: Defer   Patient used to smoke about 2 packs a day but has quit 8 months ago and takes a social drink and no history of drug abuse.    History reviewed. No pertinent family history.     FAMILY HISTORY: Mother died at the age of 32 from cardiac arrest and father had a heart attack at the age of 84.    PERSONAL HISTORY: Patient is engaged and has 3 children and works as a Sports administrator.    REVIEW OF SYSTEMS:     []          Unable to obtain  ROS due to ---   [x]          Total of 12 systems reviewed as follows:    Constitutional: negative fever, negative chills, negative weight loss  Eyes:   negative visual changes  ENT:   negative sore throat, tongue or lip swelling  Respiratory:  negative cough, negative dyspnea  Cards:  negative for  chest pain, palpitations, lower extremity edema  GI:   negative for nausea, vomiting, diarrhea, and abdominal pain  Genitourinary: negative for frequency, dysuria  Integument:  negative for rash   Hematologic:  negative for easy bruising and gum/nose bleeding  Musculoskel: negative for myalgias,  back pain  Neurological:  negative for headaches, positive for dizziness, left-sided weakness  Behavl/Psych: negative for feelings of anxiety, depression     Pertinent Positives include :    Objective:      Physical Exam:    Last 24hrs VS reviewed since prior progress note. Most recent are:    BP 138/81   Pulse 75   Temp 97.7 F (36.5 C) (Oral)   Resp 18   Ht 1.829 m (6')   Wt (!) 183.9 kg (405 lb 8 oz)   SpO2 100%   BMI 55.00 kg/m     Intake/Output Summary (Last 24 hours) at 10/30/2021 1536  Last data filed at 10/29/2021 2358  Gross per 24 hour   Intake 120 ml   Output --   Net  120 ml        General Appearance: Well developed, well nourished, alert & oriented x 3,    no acute distress.  Ears/Nose/Mouth/Throat: Pupils equal and round, Hearing grossly normal.  Neck: Supple.  JVP within normal limits. Carotids good upstrokes, with no bruit.  Chest: Lungs clear to auscultation bilaterally.  Cardiovascular: Regular rate and rhythm, S1S2 normal, no murmur, rubs, gallops.  Abdomen: Soft, non-tender, bowel sounds are active. No organomegaly.  Extremities: No edema bilaterally. Femoral pulses +2, Distal Pulses +1.  Skin: Warm and dry.  Neuro: CN II-XII grossly intact, Strength and sensation grossly intact.    []          Post-cath site without hematoma, bruit, tenderness, or thrill.  Distal pulses intact.    Data:      Telemetry: Normal sinus rhythm.    EKG:  []   No new EKG for review.        Prior to Admission medications    Medication Sig Start Date End Date Taking? Authorizing Provider   amLODIPine (NORVASC) 10 MG tablet Take 1 tablet by mouth daily    Historical Provider, MD   metFORMIN (GLUCOPHAGE) 1000 MG tablet Take 1  tablet by mouth 2 times daily (with meals)    Historical Provider, MD   carvedilol (COREG) 25 MG tablet Take 1 tablet by mouth 2 times daily (with meals)    Historical Provider, MD          Recent Results (from the past 24 hour(s))   POCT Glucose    Collection Time: 10/29/21 11:29 PM   Result Value Ref Range    POC Glucose 314 (H) 65 - 100 mg/dL    Performed by:    POCT Glucose    Collection Time: 10/30/21  8:53 AM   Result Value Ref Range    POC Glucose 272 (H) 65 - 100 mg/dL    Performed by: Suann Larry    POCT Glucose    Collection Time: 10/30/21  9:47 AM   Result Value Ref Range    POC Glucose 263 (H) 65 - 100 mg/dL    Performed by: Dillard Cannon    POCT Glucose    Collection Time: 10/30/21 11:56 AM   Result Value Ref Range    POC Glucose 279 (H) 65 - 100 mg/dL    Performed by: Georgia Lopes    Basic Metabolic Panel w/ Reflex to MG    Collection Time: 10/30/21  1:03 PM   Result Value Ref Range    Sodium 135 (L) 136 - 145 mmol/L    Potassium 4.1 3.5 - 5.1 mmol/L    Chloride 104 97 - 108 mmol/L    CO2 26 21 - 32 mmol/L    Anion Gap 5 5 - 15 mmol/L    Glucose 330 (H) 65 - 100 mg/dL    BUN 17 6 - 20 mg/dL    Creatinine Dillard Cannon (H) 0.70 - 1.30 mg/dL    Bun/Cre Ratio 11 (L) 12 - 20      Est, Glom Filt Rate 55 (L) >60 ml/min/1.3m2    Calcium 9.0 8.5 - 10.1 mg/dL   CBC with Auto Differential    Collection Time: 10/30/21  1:03 PM   Result Value Ref Range    WBC 5.3 4.1 - 11.1 K/uL    RBC 4.25 4.10 - 5.70 M/uL    Hemoglobin 12.7 12.1 - 17.0 g/dL    Hematocrit 75m 11/01/21 - 50.3 %  MCV 91.5 80.0 - 99.0 FL    MCH 29.9 26.0 - 34.0 PG    MCHC 32.6 30.0 - 36.5 g/dL    RDW 78.4 69.6 - 29.5 %    Platelets 226 150 - 400 K/uL    MPV 11.8 8.9 - 12.9 FL    Nucleated RBCs 0.0 0.0 PER 100 WBC    nRBC 0.00 0.00 - 0.01 K/uL    Neutrophils % 60 32 - 75 %    Lymphocytes % 30 12 - 49 %    Monocytes % 8 5 - 13 %    Eosinophils % 2 0 - 7 %    Basophils % 0 0 - 1 %    Immature Granulocytes 0 0 - 0.5 %    Neutrophils Absolute  3.2 1.8 - 8.0 K/UL    Lymphocytes Absolute 1.6 0.8 - 3.5 K/UL    Monocytes Absolute 0.4 0.0 - 1.0 K/UL    Eosinophils Absolute 0.1 0.0 - 0.4 K/UL    Basophils Absolute 0.0 0.0 - 0.1 K/UL    Absolute Immature Granulocyte 0.0 0.00 - 0.04 K/UL    Differential Type AUTOMATED           Assessment:   Symptoms of numbness of the left side with weakness probably need to exclude underlying neurological event.  Hypertension.  History of diabetes mellitus for at least 10 years.  Obstructive sleep apnea.  Morbid exogenous obesity.  He had a symptoms of palpitations and may need to exclude underlying cardiac arrhythmias.          Plan:   From cardiac viewpoint I will check echocardiogram and agree with the telemetry.  Otherwise we will follow medical doctors opinion or a neurologist recommendations.  Encourage him to continue his CPAP regularly.  Thank you.        []         High complexity decision making was performed    , MD

## 2021-10-30 NOTE — Care Coordination-Inpatient (Signed)
10/30/21 1521   Service Assessment   Patient Orientation Alert and Oriented   Cognition Alert   Primary Caregiver Spouse   Support Systems Spouse/Significant Other   PCP Verified by CM Yes  (Dr. Elisabeth Pigeon)   Last Visit to PCP Within last 3 months   Prior Functional Level Independent in ADLs/IADLs   Current Functional Level Independent in ADLs/IADLs   Can patient return to prior living arrangement Yes   Ability to make needs known: Good   Family able to assist with home care needs: Yes   Would you like for me to discuss the discharge plan with any other family members/significant others, and if so, who? No   Financial Resources Medicaid   Social/Functional History   Lives With Significant other   Receives Help From Family   ADL Assistance Independent   Homemaking Assistance Independent   Ambulation Assistance Independent   Transfer Assistance Independent     CM met patient at bedside.  Patient alert and oriented, lives with girlfriend in a home.  Patient is independent with ADL's.  Patient has has outpatient PT when he hurt both legs on a tractor.  Patient last visit to PCP was last month. Patient stated that his sister can be notified of discharge plans if needed.  CM will continue to follow.

## 2021-10-30 NOTE — Progress Notes (Signed)
Hospitalist Progress Note               Daily Progress Note: 10/30/2021 2:05 PM      Subjective:   F/u TIA, hypertension and diabetes    Patient reports complete resolution of his paresthesias.  No weakness.  No vision change difficulty with speech or swallowing.  No headaches.  No difficulty with balance or walking.    Otherwise no chest pain, shortness of breath, leg swelling, abdominal pain.      Assessment/Plan:   Tyler Escobar is a 41 y.o. male with diabetes, hypertension presented to the ED with chief complaint of left face, arm and leg numbness. He was driving this morning, and at about 9:15am he started noticing fluttering of his chest followed by numbness of the left side of his body. Also associated with left leg and arm weakness. He did not notice facial droop, slurred speech, vertigo or vision loss.  He presented to outside ED but unable to undergo CT scan there due to body habitus. He is then transferred to our facility. By th time he arrived to our facility, most of his symptoms resolved and only have some residue numbness of his left shoulder. In addition, he also was told to have irregular rhythm before, but unable to specify if it is atrial fibrillation and was never prescribed AC.   In the ED, hypertensive. CTA showed right P3 segment stenosis but no large vessels occlusion. No carotid artery stenosis bilaterally. CT head no acute findings    TIA, rule out stroke -paresthesias and weakness have resolved completely.  No obvious neurologic deficits on exam.  Head CT without acute process and CTA without any large vessel occlusion.  However he has significant stroke risk factors.  Awaiting MRI to rule out a small stroke.  Otherwise awaiting carotid Dopplers and echocardiogram.  I think he would also benefit from an outpatient cardiac monitor to rule out arrhythmia such as A-fib.  Lipid panel pending    Non-insulin-dependent diabetes mellitus type 2 complicated by hypertension -just on metformin which  she has not been taking regularly.  Awaiting hemoglobin A1c.  Blood sugars currently high on sliding scale so adding some Lantus in the short-term.    Hypertension in diabetes -she admits to somewhat poor compliance with his medications recently.  He was hypertensive presentation.  We discussed the importance of good blood pressure control in the context of preventing cardiovascular disease.  Hold off on ACE inhibitor given his renal dysfunction, adding hydralazine.  Continue amlodipine and carvedilol.    Elevated creatinine -AKI versus chronic kidney disease stage IIIa.  Discussed importance of maintaining good blood pressure and control of diabetes not only for cardiovascular disease standpoint but also renal function.  Limit nephrotoxic agents.  Repeat BMP.        Discussion/MDM: Patient with multiple medical comorbidities, each with high likelihood for morbidity and mortality if left untreated.   I have reviewed patient's presenting subjective and objective findings, as well as all laboratory studies, imaging studies, and vital signs to date as well as treatment rendered and patient's response to those treatments.  In addition, prior medical, surgical and relevant social and family histories were reviewed.    DVT Prophylaxis: Low risk and ambulatory  Code Status: Full Code       Disposition and discharge barriers: Pending testing    Care Plan discussed with: Patient, staff, IDR team        Current Facility-Administered Medications   Medication Dose  Route Frequency    aspirin EC tablet 81 mg  81 mg Oral Daily    glucose chewable tablet 16 g  4 tablet Oral PRN    dextrose bolus 10% 125 mL  125 mL IntraVENous PRN    Or    dextrose bolus 10% 250 mL  250 mL IntraVENous PRN    glucagon injection 1 mg  1 mg SubCUTAneous PRN    dextrose 10 % infusion   IntraVENous Continuous PRN    insulin glargine (LANTUS) injection vial 10 Units  10 Units SubCUTAneous Nightly    hydrALAZINE (APRESOLINE) tablet 25 mg  25 mg Oral 3  times per day    amLODIPine (NORVASC) tablet 10 mg  10 mg Oral Daily    carvedilol (COREG) tablet 25 mg  25 mg Oral BID WC    insulin lispro (HUMALOG) injection vial 0-8 Units  0-8 Units SubCUTAneous TID WC    insulin lispro (HUMALOG) injection vial 0-4 Units  0-4 Units SubCUTAneous Nightly    atorvastatin (LIPITOR) tablet 80 mg  80 mg Oral Nightly    sodium chloride flush 0.9 % injection 5-40 mL  5-40 mL IntraVENous 2 times per day    sodium chloride flush 0.9 % injection 5-40 mL  5-40 mL IntraVENous PRN    0.9 % sodium chloride infusion   IntraVENous PRN    enoxaparin (LOVENOX) injection 60 mg  60 mg SubCUTAneous BID    ondansetron (ZOFRAN-ODT) disintegrating tablet 4 mg  4 mg Oral Q8H PRN    Or    ondansetron (ZOFRAN) injection 4 mg  4 mg IntraVENous Q6H PRN    polyethylene glycol (GLYCOLAX) packet 17 g  17 g Oral Daily PRN    acetaminophen (TYLENOL) tablet 650 mg  650 mg Oral Q6H PRN    Or    acetaminophen (TYLENOL) suppository 650 mg  650 mg Rectal Q6H PRN            Objective:     BP (!) 161/97   Pulse 80   Temp 97.5 F (36.4 C) (Oral)   Resp 16   Ht 1.829 m (6')   Wt (!) 183.9 kg (405 lb 8 oz)   SpO2 100%   BMI 55.00 kg/m    O2 Device: None (Room air)    Temp (24hrs), Avg:97.9 F (36.6 C), Min:97.5 F (36.4 C), Max:98.2 F (36.8 C)        PHYSICAL EXAM:  General: WD, WN, NAD  Resp: lungs CTAB, no accessory muscle use  CV: rrr, no murmur, no leg edema  GI: abd soft, NT, ND, active BS  MSk: no deformities  Skin: good turgor, no rash  Neuro: alert and oriented, no focal strength deficits    Data Review    Recent Results (from the past 24 hour(s))   Troponin    Collection Time: 10/29/21  2:24 PM   Result Value Ref Range    Troponin, High Sensitivity 54 0 - 76 ng/L   POCT Glucose    Collection Time: 10/29/21 11:29 PM   Result Value Ref Range    POC Glucose 314 (H) 65 - 100 mg/dL    Performed by: Suann Larry    POCT Glucose    Collection Time: 10/30/21  8:53 AM   Result Value Ref Range    POC Glucose  272 (H) 65 - 100 mg/dL    Performed by: Dillard Cannon    POCT Glucose    Collection Time: 10/30/21  9:47 AM  Result Value Ref Range    POC Glucose 263 (H) 65 - 100 mg/dL    Performed by: Georgia Lopes    POCT Glucose    Collection Time: 10/30/21 11:56 AM   Result Value Ref Range    POC Glucose 279 (H) 65 - 100 mg/dL    Performed by: Dillard Cannon    CBC with Auto Differential    Collection Time: 10/30/21  1:03 PM   Result Value Ref Range    WBC 5.3 4.1 - 11.1 K/uL    RBC 4.25 4.10 - 5.70 M/uL    Hemoglobin 12.7 12.1 - 17.0 g/dL    Hematocrit 09.8 11.9 - 50.3 %    MCV 91.5 80.0 - 99.0 FL    MCH 29.9 26.0 - 34.0 PG    MCHC 32.6 30.0 - 36.5 g/dL    RDW 14.7 82.9 - 56.2 %    Platelets 226 150 - 400 K/uL    MPV 11.8 8.9 - 12.9 FL    Nucleated RBCs 0.0 0.0 PER 100 WBC    nRBC 0.00 0.00 - 0.01 K/uL    Neutrophils % 60 32 - 75 %    Lymphocytes % 30 12 - 49 %    Monocytes % 8 5 - 13 %    Eosinophils % 2 0 - 7 %    Basophils % 0 0 - 1 %    Immature Granulocytes 0 0 - 0.5 %    Neutrophils Absolute 3.2 1.8 - 8.0 K/UL    Lymphocytes Absolute 1.6 0.8 - 3.5 K/UL    Monocytes Absolute 0.4 0.0 - 1.0 K/UL    Eosinophils Absolute 0.1 0.0 - 0.4 K/UL    Basophils Absolute 0.0 0.0 - 0.1 K/UL    Absolute Immature Granulocyte 0.0 0.00 - 0.04 K/UL    Differential Type AUTOMATED         CT Head W/O Contrast   Final Result   No acute findings.            CTA HEAD NECK W CONTRAST   Final Result   1. No acute vascular abnormality. No large vessel occlusion.   2. 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   3. Complete opacification of the left sphenoidal sinus.          MRI BRAIN WO CONTRAST    (Results Pending)   Vascular duplex carotid bilateral    (Results Pending)       Intake and Output:  Current Shift: No intake/output data recorded.  Last three shifts: 06/15 1901 - 06/17 0700  In: 120 [P.O.:120]  Out: -       Lab/Data Review:  Recent Labs     10/29/21  1205 10/30/21  1303   WBC 5.6 5.3   HGB 12.8 12.7   HCT 38.5 38.9   PLT 213 226      Recent Labs     10/29/21  1205   NA 132*   K 4.1   CL 99   CO2 24   GLUCOSE 378*   BUN 17   CREATININE 1.50*   CALCIUM 8.8   MG 1.7   LABALBU 3.3*   BILITOT 0.4   AST 21   ALT 43     No results for input(s): PHART, PCO2ART, PO2ART, HCO3ART, BEART, TCO2ARRT, HGBART, PO2CORART, FIO2A, O2SATART, OXYHEM, CARBOXHGBART, METHGBART, O2CONTART, PHCORART, TEMP in the last 72 hours.    Invalid input(s): PCO2COART  Recent Results (from the past 24 hour(s))  Troponin    Collection Time: 10/29/21  2:24 PM   Result Value Ref Range    Troponin, High Sensitivity 54 0 - 76 ng/L   POCT Glucose    Collection Time: 10/29/21 11:29 PM   Result Value Ref Range    POC Glucose 314 (H) 65 - 100 mg/dL    Performed by: Suann Larry    POCT Glucose    Collection Time: 10/30/21  8:53 AM   Result Value Ref Range    POC Glucose 272 (H) 65 - 100 mg/dL    Performed by: Dillard Cannon    POCT Glucose    Collection Time: 10/30/21  9:47 AM   Result Value Ref Range    POC Glucose 263 (H) 65 - 100 mg/dL    Performed by: Georgia Lopes    POCT Glucose    Collection Time: 10/30/21 11:56 AM   Result Value Ref Range    POC Glucose 279 (H) 65 - 100 mg/dL    Performed by: Dillard Cannon    CBC with Auto Differential    Collection Time: 10/30/21  1:03 PM   Result Value Ref Range    WBC 5.3 4.1 - 11.1 K/uL    RBC 4.25 4.10 - 5.70 M/uL    Hemoglobin 12.7 12.1 - 17.0 g/dL    Hematocrit 28.4 13.2 - 50.3 %    MCV 91.5 80.0 - 99.0 FL    MCH 29.9 26.0 - 34.0 PG    MCHC 32.6 30.0 - 36.5 g/dL    RDW 44.0 10.2 - 72.5 %    Platelets 226 150 - 400 K/uL    MPV 11.8 8.9 - 12.9 FL    Nucleated RBCs 0.0 0.0 PER 100 WBC    nRBC 0.00 0.00 - 0.01 K/uL    Neutrophils % 60 32 - 75 %    Lymphocytes % 30 12 - 49 %    Monocytes % 8 5 - 13 %    Eosinophils % 2 0 - 7 %    Basophils % 0 0 - 1 %    Immature Granulocytes 0 0 - 0.5 %    Neutrophils Absolute 3.2 1.8 - 8.0 K/UL    Lymphocytes Absolute 1.6 0.8 - 3.5 K/UL    Monocytes Absolute 0.4 0.0 - 1.0 K/UL    Eosinophils Absolute  0.1 0.0 - 0.4 K/UL    Basophils Absolute 0.0 0.0 - 0.1 K/UL    Absolute Immature Granulocyte 0.0 0.00 - 0.04 K/UL    Differential Type AUTOMATED           Time 35 min  ______________________________________________________________________________    Cheryln Manly, MD    This is dictation was done by dragon, computer voice recognition software.  Quite often unanticipated grammatical, syntax, homophones and other interpretive errors or inadvertently transcribed by the computer software.  Please excuse errors that have escaped final proofreading.  Thank you.

## 2021-10-30 NOTE — Plan of Care (Signed)
Problem: Discharge Planning  Goal: Discharge to home or other facility with appropriate resources  Outcome: Progressing     Problem: Safety - Adult  Goal: Free from fall injury  Outcome: Progressing

## 2021-10-31 ENCOUNTER — Inpatient Hospital Stay: Admit: 2021-10-31 | Payer: MEDICAID

## 2021-10-31 LAB — LIPID PANEL
Chol/HDL Ratio: 6.1 — ABNORMAL HIGH (ref 0.0–5.0)
Cholesterol, Total: 209 mg/dL — ABNORMAL HIGH (ref <200)
HDL: 34 mg/dL
LDL Calculated: 120 mg/dL — ABNORMAL HIGH (ref 0–100)
Triglycerides: 275 mg/dL — ABNORMAL HIGH (ref <150)
VLDL Cholesterol Calculated: 55 mg/dL

## 2021-10-31 LAB — POCT GLUCOSE
POC Glucose: 328 mg/dL — ABNORMAL HIGH (ref 65–100)
POC Glucose: 226 mg/dL — ABNORMAL HIGH (ref 65–100)
POC Glucose: 234 mg/dL — ABNORMAL HIGH (ref 65–100)
POC Glucose: 306 mg/dL — ABNORMAL HIGH (ref 65–100)

## 2021-10-31 MED ORDER — HYDRALAZINE HCL 50 MG PO TABS
50 MG | Freq: Three times a day (TID) | ORAL | Status: DC
Start: 2021-10-31 — End: 2021-10-31

## 2021-10-31 MED ORDER — PERFLUTREN LIPID MICROSPHERE IV SUSP
INTRAVENOUS | Status: AC
Start: 2021-10-31 — End: 2021-10-31
  Administered 2021-10-31: 19:00:00 2 via INTRAVENOUS

## 2021-10-31 MED ORDER — LIDOCAINE VISCOUS HCL 2 % MT SOLN
2 % | Freq: Three times a day (TID) | OROMUCOSAL | Status: DC
Start: 2021-10-31 — End: 2021-10-31

## 2021-10-31 MED ORDER — INSULIN GLARGINE 100 UNIT/ML SC SOLN
100 | Freq: Every day | SUBCUTANEOUS | Status: DC
Start: 2021-10-31 — End: 2021-11-01
  Administered 2021-10-31: 21:00:00 18 [IU] via SUBCUTANEOUS

## 2021-10-31 MED ORDER — HYDRALAZINE HCL 50 MG PO TABS
50 | Freq: Three times a day (TID) | ORAL | Status: DC
Start: 2021-10-31 — End: 2021-11-02
  Administered 2021-10-31 – 2021-11-02 (×7): 100 mg via ORAL

## 2021-10-31 MED FILL — LANTUS 100 UNIT/ML SC SOLN: 100 UNIT/ML | SUBCUTANEOUS | Qty: 18

## 2021-10-31 MED FILL — HYDRALAZINE HCL 50 MG PO TABS: 50 MG | ORAL | Qty: 1

## 2021-10-31 MED FILL — CARVEDILOL 12.5 MG PO TABS: 12.5 MG | ORAL | Qty: 2

## 2021-10-31 MED FILL — HUMALOG 100 UNIT/ML IJ SOLN: 100 UNIT/ML | INTRAMUSCULAR | Qty: 2

## 2021-10-31 MED FILL — LIDOCAINE VISCOUS HCL 2 % MT SOLN: 2 % | OROMUCOSAL | Qty: 15

## 2021-10-31 MED FILL — PERFLUTREN LIPID MICROSPHERE IV SUSP: INTRAVENOUS | Qty: 2

## 2021-10-31 MED FILL — ASPIRIN LOW DOSE 81 MG PO TBEC: 81 MG | ORAL | Qty: 1

## 2021-10-31 MED FILL — AMLODIPINE BESYLATE 5 MG PO TABS: 5 MG | ORAL | Qty: 2

## 2021-10-31 MED FILL — ATORVASTATIN CALCIUM 40 MG PO TABS: 40 MG | ORAL | Qty: 2

## 2021-10-31 MED FILL — LOVENOX 60 MG/0.6ML IJ SOSY: 60 MG/0.6ML | INTRAMUSCULAR | Qty: 0.6

## 2021-10-31 MED FILL — HYDRALAZINE HCL 50 MG PO TABS: 50 MG | ORAL | Qty: 2

## 2021-10-31 MED FILL — HUMALOG 100 UNIT/ML IJ SOLN: 100 UNIT/ML | INTRAMUSCULAR | Qty: 4

## 2021-10-31 MED FILL — HUMALOG 100 UNIT/ML IJ SOLN: 100 UNIT/ML | INTRAMUSCULAR | Qty: 6

## 2021-10-31 NOTE — Progress Notes (Signed)
Hospitalist Progress Note               Daily Progress Note: 10/31/2021 12:57 PM      Subjective:   F/u TIA, hypertension and diabetes    Patient reports complete resolution of his paresthesias.  No weakness.  No vision change difficulty with speech or swallowing.  No headaches.  No difficulty with balance or walking.    Otherwise no chest pain, shortness of breath, leg swelling, abdominal pain.  Blood sugars and blood pressure remain high.    Assessment/Plan:   Tyler Escobar is a 41 y.o. male with diabetes, hypertension presented to the ED with chief complaint of left face, arm and leg numbness. He was driving this morning, and at about 9:15am he started noticing fluttering of his chest followed by numbness of the left side of his body. Also associated with left leg and arm weakness. He did not notice facial droop, slurred speech, vertigo or vision loss.  He presented to outside ED but unable to undergo CT scan there due to body habitus. He is then transferred to our facility. By th time he arrived to our facility, most of his symptoms resolved and only have some residue numbness of his left shoulder. In addition, he also was told to have irregular rhythm before, but unable to specify if it is atrial fibrillation and was never prescribed AC.   In the ED, hypertensive. CTA showed right P3 segment stenosis but no large vessels occlusion. No carotid artery stenosis bilaterally. CT head no acute findings    TIA, rule out stroke -paresthesias and weakness have resolved completely.  No obvious neurologic deficits on exam.  Head CT without acute process and CTA without any large vessel occlusion.  However he has significant stroke risk factors.  Awaiting MRI to rule out a small stroke.  Otherwise awaiting carotid Dopplers and echocardiogram.  I think he would also benefit from an outpatient cardiac monitor to rule out arrhythmia such as A-fib.  Lipid panel with total cholesterol 209 and LDL of 120 above target.  Start  statin.  Baseline LFTs okay.    Non-insulin-dependent diabetes mellitus type 2 complicated by hypertension -just on metformin which he has not been taking regularly.  Hemoglobin A1c is greater than 11, blood sugars currently high on sliding scale so added  Lantus in the short-term.  Over time with control of diet he may be able to transition back to oral agents but with such an elevated hemoglobin A1c probable stroke versus TIA and other risk factors he needs tight control of his diabetes.     Hypertension in diabetes -he admits to somewhat poor compliance with his medications recently.  He was hypertensive presentation.  We discussed the importance of good blood pressure control in the context of preventing cardiovascular disease.  Hold off on ACE inhibitor given his renal dysfunction,  hydralazine added.  Increasing dose of hydralazine today. continue amlodipine and carvedilol.    Elevated creatinine -AKI versus chronic kidney disease stage IIIa.  Discussed importance of maintaining good blood pressure and control of diabetes not only for cardiovascular disease standpoint but also renal function.  Limit nephrotoxic agents.  Repeat BMP still pending this afternoon.        Discussion/MDM: Patient with multiple medical comorbidities, each with high likelihood for morbidity and mortality if left untreated.   I have reviewed patient's presenting subjective and objective findings, as well as all laboratory studies, imaging studies, and vital signs to date as well  as treatment rendered and patient's response to those treatments.  In addition, prior medical, surgical and relevant social and family histories were reviewed.    DVT Prophylaxis: Low risk and ambulatory  Code Status: Full Code       Disposition and discharge barriers: Pending testing    Care Plan discussed with: Patient, staff, IDR team        Current Facility-Administered Medications   Medication Dose Route Frequency    hydrALAZINE (APRESOLINE) tablet 100 mg   100 mg Oral 3 times per day    insulin glargine (LANTUS) injection vial 18 Units  18 Units SubCUTAneous Daily    aspirin EC tablet 81 mg  81 mg Oral Daily    glucose chewable tablet 16 g  4 tablet Oral PRN    dextrose bolus 10% 125 mL  125 mL IntraVENous PRN    Or    dextrose bolus 10% 250 mL  250 mL IntraVENous PRN    glucagon injection 1 mg  1 mg SubCUTAneous PRN    dextrose 10 % infusion   IntraVENous Continuous PRN    amLODIPine (NORVASC) tablet 10 mg  10 mg Oral Daily    carvedilol (COREG) tablet 25 mg  25 mg Oral BID WC    insulin lispro (HUMALOG) injection vial 0-8 Units  0-8 Units SubCUTAneous TID WC    insulin lispro (HUMALOG) injection vial 0-4 Units  0-4 Units SubCUTAneous Nightly    atorvastatin (LIPITOR) tablet 80 mg  80 mg Oral Nightly    sodium chloride flush 0.9 % injection 5-40 mL  5-40 mL IntraVENous 2 times per day    sodium chloride flush 0.9 % injection 5-40 mL  5-40 mL IntraVENous PRN    0.9 % sodium chloride infusion   IntraVENous PRN    enoxaparin (LOVENOX) injection 60 mg  60 mg SubCUTAneous BID    ondansetron (ZOFRAN-ODT) disintegrating tablet 4 mg  4 mg Oral Q8H PRN    Or    ondansetron (ZOFRAN) injection 4 mg  4 mg IntraVENous Q6H PRN    polyethylene glycol (GLYCOLAX) packet 17 g  17 g Oral Daily PRN    acetaminophen (TYLENOL) tablet 650 mg  650 mg Oral Q6H PRN    Or    acetaminophen (TYLENOL) suppository 650 mg  650 mg Rectal Q6H PRN            Objective:     BP (!) 180/98   Pulse 74   Temp 98.1 F (36.7 C) (Oral)   Resp 18   Ht 1.829 m (6')   Wt (!) 183.9 kg (405 lb 8 oz)   SpO2 99%   BMI 55.00 kg/m    O2 Device: None (Room air)    Temp (24hrs), Avg:98 F (36.7 C), Min:97.7 F (36.5 C), Max:98.3 F (36.8 C)        PHYSICAL EXAM:  General: WD, WN, NAD  Resp: lungs CTAB, no accessory muscle use  CV: rrr, no murmur, no leg edema  GI: abd soft, NT, ND, active BS  MSk: no deformities  Skin: good turgor, no rash  Neuro: alert and oriented, no focal strength deficits    Data  Review    Recent Results (from the past 24 hour(s))   Hemoglobin A1C    Collection Time: 10/30/21  1:03 PM   Result Value Ref Range    Hemoglobin A1C 11.1 (H) 4.0 - 5.6 %    eAG 272 mg/dL   Lipid Panel  Collection Time: 10/30/21  1:03 PM   Result Value Ref Range    LIPID PANEL        Cholesterol, Total 209 (H) <200 mg/dL    Triglycerides 301 (H) <150 mg/dL    HDL 34 mg/dL    LDL Calculated 601 (H) 0 - 100 mg/dL    VLDL Cholesterol Calculated 55 mg/dL    Chol/HDL Ratio 6.1 (H) 0.0 - 5.0     Basic Metabolic Panel w/ Reflex to MG    Collection Time: 10/30/21  1:03 PM   Result Value Ref Range    Sodium 135 (L) 136 - 145 mmol/L    Potassium 4.1 3.5 - 5.1 mmol/L    Chloride 104 97 - 108 mmol/L    CO2 26 21 - 32 mmol/L    Anion Gap 5 5 - 15 mmol/L    Glucose 330 (H) 65 - 100 mg/dL    BUN 17 6 - 20 mg/dL    Creatinine 0.93 (H) 0.70 - 1.30 mg/dL    Bun/Cre Ratio 11 (L) 12 - 20      Est, Glom Filt Rate 55 (L) >60 ml/min/1.91m2    Calcium 9.0 8.5 - 10.1 mg/dL   CBC with Auto Differential    Collection Time: 10/30/21  1:03 PM   Result Value Ref Range    WBC 5.3 4.1 - 11.1 K/uL    RBC 4.25 4.10 - 5.70 M/uL    Hemoglobin 12.7 12.1 - 17.0 g/dL    Hematocrit 23.5 57.3 - 50.3 %    MCV 91.5 80.0 - 99.0 FL    MCH 29.9 26.0 - 34.0 PG    MCHC 32.6 30.0 - 36.5 g/dL    RDW 22.0 25.4 - 27.0 %    Platelets 226 150 - 400 K/uL    MPV 11.8 8.9 - 12.9 FL    Nucleated RBCs 0.0 0.0 PER 100 WBC    nRBC 0.00 0.00 - 0.01 K/uL    Neutrophils % 60 32 - 75 %    Lymphocytes % 30 12 - 49 %    Monocytes % 8 5 - 13 %    Eosinophils % 2 0 - 7 %    Basophils % 0 0 - 1 %    Immature Granulocytes 0 0 - 0.5 %    Neutrophils Absolute 3.2 1.8 - 8.0 K/UL    Lymphocytes Absolute 1.6 0.8 - 3.5 K/UL    Monocytes Absolute 0.4 0.0 - 1.0 K/UL    Eosinophils Absolute 0.1 0.0 - 0.4 K/UL    Basophils Absolute 0.0 0.0 - 0.1 K/UL    Absolute Immature Granulocyte 0.0 0.00 - 0.04 K/UL    Differential Type AUTOMATED     POCT Glucose    Collection Time: 10/30/21  4:56 PM    Result Value Ref Range    POC Glucose 325 (H) 65 - 100 mg/dL    Performed by: Georgia Lopes    POCT Glucose    Collection Time: 10/30/21  8:42 PM   Result Value Ref Range    POC Glucose 328 (H) 65 - 100 mg/dL    Performed by: [place] JENNIFER    POCT Glucose    Collection Time: 10/31/21  7:52 AM   Result Value Ref Range    POC Glucose 226 (H) 65 - 100 mg/dL    Performed by: Georgia Lopes    POCT Glucose    Collection Time: 10/31/21 11:46 AM   Result Value Ref Range  POC Glucose 234 (H) 65 - 100 mg/dL    Performed by: Caswell CorwinOX ANGELA    Vascular duplex carotid bilateral    Collection Time: 10/31/21 12:53 PM   Result Value Ref Range    Left CCA dist PSV 88.0 cm/s    Left CCA dist EDV 15.1 cm/s    Left CCA prox PSV 122.0 cm/s    Left CCA prox EDV 13.7 cm/s    Left ICA dist PSV 73.9 cm/s    Left ICA dist EDV 29.6 cm/s    Left ICA prox PSV 55.9 cm/s    Left ICA prox EDV 16.3 cm/s    Left ECA PSV 131.0 cm/s    Left ECA EDV 14.40 cm/s    Left subclavian prox PSV 152.0 cm/s    Left subclavian prox EDV 0.0 cm/s    Left vertebral PSV 48.6 cm/s    Left vertebral EDV 12.00 cm/s    Right cca dist PSV 66.6 cm/s    Right CCA dist EDV 12.9 cm/s    Right CCA prox PSV 137.0 cm/s    Right CCA prox EDV 18.3 cm/s    Right ICA dist PSV 72.1 cm/s    Right ICA dist EDV 27.7 cm/s    Right ICA prox PSV 68.3 cm/s    Right ICA prox EDV 20.6 cm/s    Right ECA PSV 107.0 cm/s    Right ECA EDV 17.50 cm/s    Right subclavian prox PSV 97.8 cm/s    Right subclavian prox EDV 0.0 cm/s    Right vertebral PSV 68.8 cm/s    Right vertebral EDV 15.70 cm/s    Body Surface Area 3.08 m2    Right ICA/CCA PSV 1.07     Right arm BP 180 mmHg    Left ICA/CCA PSV 0.84        Vascular duplex carotid bilateral         CT Head W/O Contrast   Final Result   No acute findings.            CTA HEAD NECK W CONTRAST   Final Result   1. No acute vascular abnormality. No large vessel occlusion.   2. 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   3. Complete opacification of the  left sphenoidal sinus.          MRI BRAIN WO CONTRAST    (Results Pending)   US RETROPERITONEAL COMPLETE    (Results Pending)       Intake and Output:  Current Shift: No intake/output data recorded.  Last three shifts: 06/16 1901 - 06/18 0700  In: 120 [P.O.:120]  Out: -       Lab/Data Review:  Recent Labs     10/29/21  1205 10/30/21  1303   WBC 5.6 5.3   HGB 12.8 12.7   HCT 38.5 38.9   PLT 213 226       Recent Labs     10/29/21  1205 10/30/21  1303   NA 132* 135*   K 4.1 4.1   CL 99 104   CO2 24 26   GLUCOSE 378* 330*   BUN 17 17   CREATININE 1.50* 1.59*   CALCIUM 8.8 9.0   MG 1.7  --    LABALBU 3.3*  --    BILITOT 0.4  --    AST 21  --    ALT 43  --        No results for input(s): PHART,  PCO2ART, PO2ART, HCO3ART, BEART, TCO2ARRT, HGBART, PO2CORART, FIO2A, O2SATART, OXYHEM, CARBOXHGBART, METHGBART, O2CONTART, PHCORART, TEMP in the last 72 hours.    Invalid input(s): PCO2COART  Recent Results (from the past 24 hour(s))   Hemoglobin A1C    Collection Time: 10/30/21  1:03 PM   Result Value Ref Range    Hemoglobin A1C 11.1 (H) 4.0 - 5.6 %    eAG 272 mg/dL   Lipid Panel    Collection Time: 10/30/21  1:03 PM   Result Value Ref Range    LIPID PANEL        Cholesterol, Total 209 (H) <200 mg/dL    Triglycerides 951 (H) <150 mg/dL    HDL 34 mg/dL    LDL Calculated 884 (H) 0 - 100 mg/dL    VLDL Cholesterol Calculated 55 mg/dL    Chol/HDL Ratio 6.1 (H) 0.0 - 5.0     Basic Metabolic Panel w/ Reflex to MG    Collection Time: 10/30/21  1:03 PM   Result Value Ref Range    Sodium 135 (L) 136 - 145 mmol/L    Potassium 4.1 3.5 - 5.1 mmol/L    Chloride 104 97 - 108 mmol/L    CO2 26 21 - 32 mmol/L    Anion Gap 5 5 - 15 mmol/L    Glucose 330 (H) 65 - 100 mg/dL    BUN 17 6 - 20 mg/dL    Creatinine 1.66 (H) 0.70 - 1.30 mg/dL    Bun/Cre Ratio 11 (L) 12 - 20      Est, Glom Filt Rate 55 (L) >60 ml/min/1.85m2    Calcium 9.0 8.5 - 10.1 mg/dL   CBC with Auto Differential    Collection Time: 10/30/21  1:03 PM   Result Value Ref Range    WBC 5.3  4.1 - 11.1 K/uL    RBC 4.25 4.10 - 5.70 M/uL    Hemoglobin 12.7 12.1 - 17.0 g/dL    Hematocrit 06.3 01.6 - 50.3 %    MCV 91.5 80.0 - 99.0 FL    MCH 29.9 26.0 - 34.0 PG    MCHC 32.6 30.0 - 36.5 g/dL    RDW 01.0 93.2 - 35.5 %    Platelets 226 150 - 400 K/uL    MPV 11.8 8.9 - 12.9 FL    Nucleated RBCs 0.0 0.0 PER 100 WBC    nRBC 0.00 0.00 - 0.01 K/uL    Neutrophils % 60 32 - 75 %    Lymphocytes % 30 12 - 49 %    Monocytes % 8 5 - 13 %    Eosinophils % 2 0 - 7 %    Basophils % 0 0 - 1 %    Immature Granulocytes 0 0 - 0.5 %    Neutrophils Absolute 3.2 1.8 - 8.0 K/UL    Lymphocytes Absolute 1.6 0.8 - 3.5 K/UL    Monocytes Absolute 0.4 0.0 - 1.0 K/UL    Eosinophils Absolute 0.1 0.0 - 0.4 K/UL    Basophils Absolute 0.0 0.0 - 0.1 K/UL    Absolute Immature Granulocyte 0.0 0.00 - 0.04 K/UL    Differential Type AUTOMATED     POCT Glucose    Collection Time: 10/30/21  4:56 PM   Result Value Ref Range    POC Glucose 325 (H) 65 - 100 mg/dL    Performed by: Georgia Lopes    POCT Glucose    Collection Time: 10/30/21  8:42 PM   Result Value Ref  Range    POC Glucose 328 (H) 65 - 100 mg/dL    Performed by: [place] JENNIFER    POCT Glucose    Collection Time: 10/31/21  7:52 AM   Result Value Ref Range    POC Glucose 226 (H) 65 - 100 mg/dL    Performed by: Georgia Lopes    POCT Glucose    Collection Time: 10/31/21 11:46 AM   Result Value Ref Range    POC Glucose 234 (H) 65 - 100 mg/dL    Performed by: Caswell Corwin    Vascular duplex carotid bilateral    Collection Time: 10/31/21 12:53 PM   Result Value Ref Range    Left CCA dist PSV 88.0 cm/s    Left CCA dist EDV 15.1 cm/s    Left CCA prox PSV 122.0 cm/s    Left CCA prox EDV 13.7 cm/s    Left ICA dist PSV 73.9 cm/s    Left ICA dist EDV 29.6 cm/s    Left ICA prox PSV 55.9 cm/s    Left ICA prox EDV 16.3 cm/s    Left ECA PSV 131.0 cm/s    Left ECA EDV 14.40 cm/s    Left subclavian prox PSV 152.0 cm/s    Left subclavian prox EDV 0.0 cm/s    Left vertebral PSV 48.6 cm/s    Left vertebral EDV  12.00 cm/s    Right cca dist PSV 66.6 cm/s    Right CCA dist EDV 12.9 cm/s    Right CCA prox PSV 137.0 cm/s    Right CCA prox EDV 18.3 cm/s    Right ICA dist PSV 72.1 cm/s    Right ICA dist EDV 27.7 cm/s    Right ICA prox PSV 68.3 cm/s    Right ICA prox EDV 20.6 cm/s    Right ECA PSV 107.0 cm/s    Right ECA EDV 17.50 cm/s    Right subclavian prox PSV 97.8 cm/s    Right subclavian prox EDV 0.0 cm/s    Right vertebral PSV 68.8 cm/s    Right vertebral EDV 15.70 cm/s    Body Surface Area 3.08 m2    Right ICA/CCA PSV 1.07     Right arm BP 180 mmHg    Left ICA/CCA PSV 0.84            Time 35 min  ______________________________________________________________________________    Cheryln Manly, MD    This is dictation was done by dragon, computer voice recognition software.  Quite often unanticipated grammatical, syntax, homophones and other interpretive errors or inadvertently transcribed by the computer software.  Please excuse errors that have escaped final proofreading.  Thank you.

## 2021-10-31 NOTE — Progress Notes (Signed)
Unable to get MRI due to body habitus  Repeat CT for evolving stroke  Can consider outpatient open MRI

## 2021-10-31 NOTE — Plan of Care (Signed)
Problem: Discharge Planning  Goal: Discharge to home or other facility with appropriate resources  Outcome: Progressing     Problem: Safety - Adult  Goal: Free from fall injury  Outcome: Progressing

## 2021-10-31 NOTE — Progress Notes (Signed)
Progress Note      10/31/2021 1:35 PM  NAME: Tyler Escobar   RSW:[id number]   Admit Diagnosis: TIA (transient ischemic attack) [G45.9]  Suspected cerebrovascular accident (CVA) [R09.89]      Subjective:   Chart reviewed.  Echocardiogram results explained.  Patient feels much better.  Denies any weakness or chest pain or shortness of breath.    Review of Systems:    Symptom Y/N Comments  Symptom Y/N Comments   Fever/Chills n   Chest Pain n    Poor Appetite    Edema     Cough    Abdominal Pain n    Sputum    Joint Pain     SOB/DOE n   Pruritis/Rash     Nausea/vomit    Other     Diarrhea         Constipation           Could NOT obtain due to:      Objective:          Physical Exam:    Last 24hrs VS reviewed since prior progress note. Most recent are:    BP (!) 180/98   Pulse 74   Temp 98.1 F (36.7 C) (Oral)   Resp 18   Ht 1.829 m (6')   Wt (!) 183.9 kg (405 lb 8 oz)   SpO2 99%   BMI 55.00 kg/m   No intake or output data in the 24 hours ending 10/31/21 1335     General Appearance: Well developed, well nourished, alert & oriented x 3,    no acute distress.  Ears/Nose/Mouth/Throat: Hearing grossly normal.  Neck: Supple.  Chest: Lungs clear to auscultation bilaterally.  Cardiovascular: Regular rate and rhythm, S1,S2 normal, no murmur.  Abdomen: Soft, non-tender, bowel sounds are active.  Extremities: No edema bilaterally.  Skin: Warm and dry.    []          Post-cath site without hematoma, bruit, tenderness, or thrill.  Distal pulses intact.    PMH/SH reviewed - no change compared to H&P    Data Review    Telemetry: normal sinus rhythm     EKG:   []   No new EKG for review    Lab Data Personally Reviewed:    Recent Labs     10/29/21  1205 10/30/21  1303   WBC 5.6 5.3   HGB 12.8 12.7   HCT 38.5 38.9   PLT 213 226     No results for input(s): INR, PROTIME, APTT in the last 72 hours.   Recent Labs     10/29/21  1205 10/30/21  1303   NA 132* 135*   K 4.1 4.1   CL 99 104   CO2 24 26   BUN 17 17   CREATININE 1.50*  1.59*   GLUCOSE 378* 330*   CALCIUM 8.8 9.0   MG 1.7  --      No results for input(s): CKTOTAL, CKMB, TROPONINI, BNP, PROBNP, NTPROBNP in the last 72 hours.    Invalid input(s): CKINDEX  Lab Results   Component Value Date/Time    CHOL 209 10/30/2021 01:03 PM    TRIG 275 10/30/2021 01:03 PM    HDL 34 10/30/2021 01:03 PM    LDLCALC 120 10/30/2021 01:03 PM    LABVLDL 55 10/30/2021 01:03 PM    CHOLHDLRATIO 6.1 10/30/2021 01:03 PM       Recent Labs     10/29/21  1205   AST 21  ALKPHOS 80   LABALBU 3.3*   GLOB 4.0     No results for input(s): PH, PCO2, PO2 in the last 72 hours.    Medications Personally Reviewed:    Current Facility-Administered Medications   Medication Dose Route Frequency    hydrALAZINE (APRESOLINE) tablet 100 mg  100 mg Oral 3 times per day    insulin glargine (LANTUS) injection vial 18 Units  18 Units SubCUTAneous Daily    aspirin EC tablet 81 mg  81 mg Oral Daily    glucose chewable tablet 16 g  4 tablet Oral PRN    dextrose bolus 10% 125 mL  125 mL IntraVENous PRN    Or    dextrose bolus 10% 250 mL  250 mL IntraVENous PRN    glucagon injection 1 mg  1 mg SubCUTAneous PRN    dextrose 10 % infusion   IntraVENous Continuous PRN    amLODIPine (NORVASC) tablet 10 mg  10 mg Oral Daily    carvedilol (COREG) tablet 25 mg  25 mg Oral BID WC    insulin lispro (HUMALOG) injection vial 0-8 Units  0-8 Units SubCUTAneous TID WC    insulin lispro (HUMALOG) injection vial 0-4 Units  0-4 Units SubCUTAneous Nightly    atorvastatin (LIPITOR) tablet 80 mg  80 mg Oral Nightly    sodium chloride flush 0.9 % injection 5-40 mL  5-40 mL IntraVENous 2 times per day    sodium chloride flush 0.9 % injection 5-40 mL  5-40 mL IntraVENous PRN    0.9 % sodium chloride infusion   IntraVENous PRN    enoxaparin (LOVENOX) injection 60 mg  60 mg SubCUTAneous BID    ondansetron (ZOFRAN-ODT) disintegrating tablet 4 mg  4 mg Oral Q8H PRN    Or    ondansetron (ZOFRAN) injection 4 mg  4 mg IntraVENous Q6H PRN    polyethylene glycol  (GLYCOLAX) packet 17 g  17 g Oral Daily PRN    acetaminophen (TYLENOL) tablet 650 mg  650 mg Oral Q6H PRN    Or    acetaminophen (TYLENOL) suppository 650 mg  650 mg Rectal Q6H PRN           Problem List:   Symptoms of left-sided weakness has resolved.  Blood pressure is uncontrolled.  Diabetes mellitus.  Morbid exogenous obesity.  Obstructive sleep apnea.       Assessment/Plan:   I will adjust hypertensive medications.  Otherwise continue present care.  2.           []        High complexity decision making was performed in this patient at high risk for decompensation with multiple organ involvement.    , MD

## 2021-11-01 ENCOUNTER — Inpatient Hospital Stay: Admit: 2021-11-01 | Payer: MEDICAID

## 2021-11-01 LAB — ECHO (TTE) COMPLETE (PRN CONTRAST/BUBBLE/STRAIN/3D)
AV Area by Peak Velocity: 2.5 cm2
AV Peak Gradient: 10 mmHg
AV Peak Velocity: 1.6 m/s
AV Velocity Ratio: 0.69
AVA/BSA Peak Velocity: 0.9 cm2/m2
Ao Root Index: 1.31 cm/m2
Aortic Root: 3.8 cm
Ascending Aorta Index: 1.27 cm/m2
Ascending Aorta: 3.7 cm
Body Surface Area: 3.08 m2
E/E' Lateral: 14
E/E' Ratio (Averaged): 14
E/E' Septal: 14
EF BP: 65 % (ref 55–100)
Fractional Shortening 2D: 40 % (ref 28–44)
IVSd: 1.2 cm — AB (ref 0.6–1.0)
LA Area 2C: 25.7 cm2
LA Area 4C: 20.2 cm2
LA Diameter: 3.9 cm
LA Major Axis: 5.5 cm
LA Minor Axis: 6.1 cm
LA Size Index: 1.34 cm/m2
LA Volume A-L A4C: 64 mL — AB (ref 18–58)
LA Volume A-L A4C: 89 mL — AB (ref 18–58)
LA Volume BP: 79 mL — AB (ref 18–58)
LA Volume Index A-L A2C: 31 mL/m2 (ref 16–34)
LA Volume Index A-L A4C: 22 mL/m2 (ref 16–34)
LA Volume Index BP: 27 ml/m2 (ref 16–34)
LA/AO Root Ratio: 1.03
LV E' Lateral Velocity: 7 cm/s
LV E' Septal Velocity: 7 cm/s
LV EDV A2C: 141 mL
LV EDV A4C: 173 mL
LV EDV Index A2C: 48 mL/m2
LV EDV Index A4C: 59 mL/m2
LV ESV A2C: 52 mL
LV ESV A4C: 53 mL
LV ESV Index A2C: 18 mL/m2
LV ESV Index A4C: 18 mL/m2
LV Ejection Fraction A2C: 63 %
LV Ejection Fraction A4C: 69 %
LV Mass 2D Index: 90.5 g/m2 (ref 49–115)
LV Mass 2D: 263.4 g — AB (ref 88–224)
LV RWT Ratio: 0.5
LVIDd Index: 1.79 cm/m2
LVIDd: 5.2 cm (ref 4.2–5.9)
LVIDs Index: 1.07 cm/m2
LVIDs: 3.1 cm
LVOT Area: 3.5 cm2
LVOT Diameter: 2.1 cm
LVOT Peak Gradient: 5 mmHg
LVOT Peak Velocity: 1.1 m/s
LVPWd: 1.3 cm — AB (ref 0.6–1.0)
MV A Velocity: 0.77 m/s
MV E Velocity: 0.98 m/s
MV E Wave Deceleration Time: 198 ms
MV E/A: 1.27
PV Max Velocity: 0.8 m/s
PV Peak Gradient: 3 mmHg
RA Area 4C: 19.7 cm2
RA Volume Index A4C: 19 mL/m2
RA Volume: 56 ml
TAPSE: 2.3 cm (ref 1.7–?)
TR Max Velocity: 1.92 m/s
TR Peak Gradient: 15 mmHg

## 2021-11-01 LAB — POCT GLUCOSE
POC Glucose: 289 mg/dL — ABNORMAL HIGH (ref 65–100)
POC Glucose: 240 mg/dL — ABNORMAL HIGH (ref 65–100)
POC Glucose: 228 mg/dL — ABNORMAL HIGH (ref 65–100)
POC Glucose: 237 mg/dL — ABNORMAL HIGH (ref 65–100)

## 2021-11-01 LAB — VAS DUP CAROTID BILATERAL
Body Surface Area: 3.08 m2
Left CCA dist EDV: 15.1 cm/s
Left CCA dist PSV: 88 cm/s
Left CCA prox EDV: 13.7 cm/s
Left CCA prox PSV: 122 cm/s
Left ECA EDV: 14.4 cm/s
Left ECA PSV: 131 cm/s
Left ICA dist EDV: 29.6 cm/s
Left ICA dist PSV: 73.9 cm/s
Left ICA prox EDV: 16.3 cm/s
Left ICA prox PSV: 55.9 cm/s
Left ICA/CCA PSV: 0.84
Left subclavian prox EDV: 0 cm/s
Left subclavian prox PSV: 152 cm/s
Left vertebral EDV: 12 cm/s
Left vertebral PSV: 48.6 cm/s
Right CCA dist EDV: 12.9 cm/s
Right CCA prox EDV: 18.3 cm/s
Right CCA prox PSV: 137 cm/s
Right ECA EDV: 17.5 cm/s
Right ECA PSV: 107 cm/s
Right ICA dist EDV: 27.7 cm/s
Right ICA dist PSV: 72.1 cm/s
Right ICA prox EDV: 20.6 cm/s
Right ICA prox PSV: 68.3 cm/s
Right ICA/CCA PSV: 1.07
Right arm BP: 180 mmHg
Right cca dist PSV: 66.6 cm/s
Right subclavian prox EDV: 0 cm/s
Right subclavian prox PSV: 97.8 cm/s
Right vertebral EDV: 15.7 cm/s
Right vertebral PSV: 68.8 cm/s

## 2021-11-01 LAB — BASIC METABOLIC PANEL W/ REFLEX TO MG FOR LOW K
Anion Gap: 4 mmol/L — ABNORMAL LOW (ref 5–15)
BUN: 12 mg/dL (ref 6–20)
Bun/Cre Ratio: 10 — ABNORMAL LOW (ref 12–20)
CO2: 29 mmol/L (ref 21–32)
Calcium: 8.9 mg/dL (ref 8.5–10.1)
Chloride: 105 mmol/L (ref 97–108)
Creatinine: 1.2 mg/dL (ref 0.70–1.30)
Est, Glom Filt Rate: >60 mL/min/{1.73_m2} (ref >60)
Glucose: 195 mg/dL — ABNORMAL HIGH (ref 65–100)
Potassium: 4 mmol/L (ref 3.5–5.1)
Sodium: 138 mmol/L (ref 136–145)

## 2021-11-01 LAB — EKG 12-LEAD
Atrial Rate: 103 {beats}/min
P Axis: 45 degrees
P-R Interval: 153 ms
Q-T Interval: 349 ms
QRS Duration: 96 ms
QTc Calculation (Bazett): 455 ms
R Axis: 53 degrees
T Axis: -1 degrees
Ventricular Rate: 102 {beats}/min

## 2021-11-01 MED ORDER — CLONIDINE HCL 0.1 MG PO TABS
0.1 | Freq: Two times a day (BID) | ORAL | Status: DC
Start: 2021-11-01 — End: 2021-11-02
  Administered 2021-11-01 – 2021-11-02 (×3): 0.1 mg via ORAL

## 2021-11-01 MED ORDER — LISINOPRIL 10 MG PO TABS
10 | Freq: Every day | ORAL | Status: DC
Start: 2021-11-01 — End: 2021-11-02
  Administered 2021-11-01 – 2021-11-02 (×2): 10 mg via ORAL

## 2021-11-01 MED ORDER — INSULIN LISPRO 100 UNIT/ML IJ SOLN
100 | Freq: Three times a day (TID) | INTRAMUSCULAR | Status: DC
Start: 2021-11-01 — End: 2021-11-02
  Administered 2021-11-01 – 2021-11-02 (×4): 5 [IU] via SUBCUTANEOUS

## 2021-11-01 MED ORDER — INSULIN GLARGINE 100 UNIT/ML SC SOLN
100 | Freq: Two times a day (BID) | SUBCUTANEOUS | Status: DC
Start: 2021-11-01 — End: 2021-11-02
  Administered 2021-11-01 – 2021-11-02 (×3): 24 [IU] via SUBCUTANEOUS

## 2021-11-01 MED FILL — HUMALOG 100 UNIT/ML IJ SOLN: 100 UNIT/ML | INTRAMUSCULAR | Qty: 2

## 2021-11-01 MED FILL — LOVENOX 60 MG/0.6ML IJ SOSY: 60 MG/0.6ML | INTRAMUSCULAR | Qty: 0.6

## 2021-11-01 MED FILL — CARVEDILOL 12.5 MG PO TABS: 12.5 MG | ORAL | Qty: 2

## 2021-11-01 MED FILL — ATORVASTATIN CALCIUM 40 MG PO TABS: 40 MG | ORAL | Qty: 2

## 2021-11-01 MED FILL — HUMALOG 100 UNIT/ML IJ SOLN: 100 UNIT/ML | INTRAMUSCULAR | Qty: 5

## 2021-11-01 MED FILL — HYDRALAZINE HCL 50 MG PO TABS: 50 MG | ORAL | Qty: 2

## 2021-11-01 MED FILL — LANTUS 100 UNIT/ML SC SOLN: 100 UNIT/ML | SUBCUTANEOUS | Qty: 24

## 2021-11-01 MED FILL — CLONIDINE HCL 0.1 MG PO TABS: 0.1 MG | ORAL | Qty: 1

## 2021-11-01 MED FILL — AMLODIPINE BESYLATE 5 MG PO TABS: 5 MG | ORAL | Qty: 2

## 2021-11-01 MED FILL — ASPIRIN LOW DOSE 81 MG PO TBEC: 81 MG | ORAL | Qty: 1

## 2021-11-01 MED FILL — LISINOPRIL 10 MG PO TABS: 10 MG | ORAL | Qty: 1

## 2021-11-01 NOTE — Progress Notes (Signed)
Hospitalist Progress Note               Daily Progress Note: 11/01/2021 3:51 PM      Subjective:   F/u TIA, hypertension and diabetes    Patient reports complete resolution of his paresthesias.  No weakness.  No vision change difficulty with speech or swallowing.  No headaches.  No difficulty with balance or walking.    Otherwise no chest pain, shortness of breath, leg swelling, abdominal pain.      Assessment/Plan:   Tyler Escobar is a 41 y.o. male with diabetes, hypertension presented to the ED with chief complaint of left face, arm and leg numbness. He was driving this morning, and at about 9:15am he started noticing fluttering of his chest followed by numbness of the left side of his body. Also associated with left leg and arm weakness. He did not notice facial droop, slurred speech, vertigo or vision loss.  He presented to outside ED but unable to undergo CT scan there due to body habitus. He is then transferred to our facility. By th time he arrived to our facility, most of his symptoms resolved and only have some residue numbness of his left shoulder. In addition, he also was told to have irregular rhythm before, but unable to specify if it is atrial fibrillation and was never prescribed AC.   In the ED, hypertensive. CTA showed right P3 segment stenosis but no large vessels occlusion. No carotid artery stenosis bilaterally. CT head no acute findings    TIA, rule out stroke -paresthesias and weakness have resolved completely.  No obvious neurologic deficits on exam.  Head CT without acute process and CTA without any large vessel occlusion.  However he has significant stroke risk factors.  MRI due to body habitus, repeat head CT for follow-up negative for acute stroke.  Echo with preserved EF and negative bubble study.  Carotid studies without hemodynamically significant stenosis I think he would also benefit from an outpatient cardiac monitor to rule out arrhythmia such as A-fib.  Lipid panel with total  cholesterol 209 and LDL of 120 above target.  Started statin.  Baseline LFTs okay.    Non-insulin-dependent diabetes mellitus type 2 complicated by hypertension -just on metformin which he has not been taking regularly.  Hemoglobin A1c is greater than 11, blood sugars currently high on sliding scale so added  Lantus in the short-term.  Over time with control of diet he may be able to transition back to oral agents but with such an elevated hemoglobin A1c probable stroke versus TIA and other risk factors he needs tight control of his diabetes.  Continue to adjust Lantus and adding mealtime coverage.  He should follow up with diabetes educator on discharge    Hypertension in diabetes -he admits to somewhat poor compliance with his medications recently.  He was hypertensive presentation.  We discussed the importance of good blood pressure control in the context of preventing cardiovascular disease.  Hold off on ACE inhibitor given his renal dysfunction,  hydralazine added.  Increasing dose of hydralazine today. continue amlodipine and carvedilol. Appreciate cardiology input.    AKI -has improved so suspect due to dehydration from hyperglycemia. i discussed importance of maintaining good blood pressure and control of diabetes not only for cardiovascular disease standpoint but also renal function.  Limit nephrotoxic agents.          Discussion/MDM: Patient with multiple medical comorbidities, each with high likelihood for morbidity and mortality if left untreated.  I have reviewed patient's presenting subjective and objective findings, as well as all laboratory studies, imaging studies, and vital signs to date as well as treatment rendered and patient's response to those treatments.  In addition, prior medical, surgical and relevant social and family histories were reviewed.    DVT Prophylaxis: Low risk and ambulatory  Code Status: Full Code       Disposition and discharge barriers: Pending testing    Care Plan  discussed with: Patient, staff, IDR team        Current Facility-Administered Medications   Medication Dose Route Frequency    insulin glargine (LANTUS) injection vial 24 Units  24 Units SubCUTAneous BID    lisinopril (PRINIVIL;ZESTRIL) tablet 10 mg  10 mg Oral Daily    cloNIDine (CATAPRES) tablet 0.1 mg  0.1 mg Oral BID    hydrALAZINE (APRESOLINE) tablet 100 mg  100 mg Oral 3 times per day    aspirin EC tablet 81 mg  81 mg Oral Daily    glucose chewable tablet 16 g  4 tablet Oral PRN    dextrose bolus 10% 125 mL  125 mL IntraVENous PRN    Or    dextrose bolus 10% 250 mL  250 mL IntraVENous PRN    glucagon injection 1 mg  1 mg SubCUTAneous PRN    dextrose 10 % infusion   IntraVENous Continuous PRN    amLODIPine (NORVASC) tablet 10 mg  10 mg Oral Daily    carvedilol (COREG) tablet 25 mg  25 mg Oral BID WC    insulin lispro (HUMALOG) injection vial 0-8 Units  0-8 Units SubCUTAneous TID WC    insulin lispro (HUMALOG) injection vial 0-4 Units  0-4 Units SubCUTAneous Nightly    atorvastatin (LIPITOR) tablet 80 mg  80 mg Oral Nightly    sodium chloride flush 0.9 % injection 5-40 mL  5-40 mL IntraVENous 2 times per day    sodium chloride flush 0.9 % injection 5-40 mL  5-40 mL IntraVENous PRN    0.9 % sodium chloride infusion   IntraVENous PRN    enoxaparin (LOVENOX) injection 60 mg  60 mg SubCUTAneous BID    ondansetron (ZOFRAN-ODT) disintegrating tablet 4 mg  4 mg Oral Q8H PRN    Or    ondansetron (ZOFRAN) injection 4 mg  4 mg IntraVENous Q6H PRN    polyethylene glycol (GLYCOLAX) packet 17 g  17 g Oral Daily PRN    acetaminophen (TYLENOL) tablet 650 mg  650 mg Oral Q6H PRN    Or    acetaminophen (TYLENOL) suppository 650 mg  650 mg Rectal Q6H PRN            Objective:     BP (!) 145/77 Comment: notified nurse  Pulse 74   Temp 97.5 F (36.4 C) (Oral)   Resp 17   Ht 1.829 m (6')   Wt (!) 183.9 kg (405 lb 8 oz)   SpO2 97%   BMI 55.00 kg/m    O2 Device: None (Room air)    Temp (24hrs), Avg:97.7 F (36.5 C), Min:97.4  F (36.3 C), Max:98.2 F (36.8 C)        PHYSICAL EXAM:  General: WD, WN, NAD  Resp: lungs CTAB, no accessory muscle use  CV: rrr, no murmur, no leg edema  GI: abd soft, NT, ND, active BS  MSk: no deformities  Skin: good turgor, no rash  Neuro: alert and oriented, no focal strength deficits    Data Review  Recent Results (from the past 24 hour(s))   POCT Glucose    Collection Time: 10/31/21  4:27 PM   Result Value Ref Range    POC Glucose 306 (H) 65 - 100 mg/dL    Performed by: [facility] ANGELA    POCT Glucose    Collection Time: 10/31/21  7:49 PM   Result Value Ref Range    POC Glucose 289 (H) 65 - 100 mg/dL    Performed by: [place] JENNIFER    Basic Metabolic Panel w/ Reflex to MG    Collection Time: 11/01/21  6:47 AM   Result Value Ref Range    Sodium 138 136 - 145 mmol/L    Potassium 4.0 3.5 - 5.1 mmol/L    Chloride 105 97 - 108 mmol/L    CO2 29 21 - 32 mmol/L    Anion Gap 4 (L) 5 - 15 mmol/L    Glucose 195 (H) 65 - 100 mg/dL    BUN 12 6 - 20 mg/dL    Creatinine 1.61 0.96 - 1.30 mg/dL    Bun/Cre Ratio 10 (L) 12 - 20      Est, Glom Filt Rate >60 >60 ml/min/1.64m2    Calcium 8.9 8.5 - 10.1 mg/dL   POCT Glucose    Collection Time: 11/01/21  8:25 AM   Result Value Ref Range    POC Glucose 240 (H) 65 - 100 mg/dL    Performed by: Blanchard Mane    POCT Glucose    Collection Time: 11/01/21 11:55 AM   Result Value Ref Range    POC Glucose 228 (H) 65 - 100 mg/dL    Performed by: Blanchard Mane        CT HEAD WO CONTRAST   Final Result   No acute abnormality.            Vascular duplex carotid bilateral   Final Result      [place] RETROPERITONEAL COMPLETE   Final Result   Normal renal ultrasound examination.      CT Head W/O Contrast   Final Result   No acute findings.            CTA HEAD NECK W CONTRAST   Final Result   1. No acute vascular abnormality. No large vessel occlusion.   2. 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   3. Complete opacification of the left sphenoidal sinus.              Intake and Output:  Current Shift:  No intake/output data recorded.  Last three shifts: 06/17 1901 - 06/19 0700  In: 10 [I.V.:10]  Out: -       Lab/Data Review:  Recent Labs     10/30/21  1303   WBC 5.3   HGB 12.7   HCT 38.9   PLT 226       Recent Labs     10/30/21  1303 11/01/21  0647   NA 135* 138   K 4.1 4.0   CL 104 105   CO2 26 29   GLUCOSE 330* 195*   BUN 17 12   CREATININE 1.59* 1.20   CALCIUM 9.0 8.9       No results for input(s): PHART, PCO2ART, PO2ART, HCO3ART, BEART, TCO2ARRT, HGBART, PO2CORART, FIO2A, O2SATART, OXYHEM, CARBOXHGBART, METHGBART, O2CONTART, PHCORART, TEMP in the last 72 hours.    Invalid input(s): PCO2COART  Recent Results (from the past 24 hour(s))   POCT Glucose    Collection Time: 10/31/21  4:27 PM   Result Value Ref Range    POC Glucose 306 (H) 65 - 100 mg/dL    Performed by: [facility] AN[facility]    POCT Glucose    Collection Time: 10/31/21  7:49 PM   Result Value Ref Range    POC Glucose 289 (H) 65 - 100 mg/dL    Performed by: [place] JENNIFER    Basic Metabolic Panel w/ Reflex to MG    Collection Time: 11/01/21  6:47 AM   Result Value Ref Range    Sodium 138 136 - 145 mmol/L    Potassium 4.0 3.5 - 5.1 mmol/L    Chloride 105 97 - 108 mmol/L    CO2 29 21 - 32 mmol/L    Anion Gap 4 (L) 5 - 15 mmol/L    Glucose 195 (H) 65 - 100 mg/dL    BUN 12 6 - 20 mg/dL    Creatinine 3.38 2.50 - 1.30 mg/dL    Bun/Cre Ratio 10 (L) 12 - 20      Est, Glom Filt Rate >60 >60 ml/min/1.46m2    Calcium 8.9 8.5 - 10.1 mg/dL   POCT Glucose    Collection Time: 11/01/21  8:25 AM   Result Value Ref Range    POC Glucose 240 (H) 65 - 100 mg/dL    Performed by: Blanchard Mane    POCT Glucose    Collection Time: 11/01/21 11:55 AM   Result Value Ref Range    POC Glucose 228 (H) 65 - 100 mg/dL    Performed by: Blanchard Mane            Time 35 min  ______________________________________________________________________________    Cheryln Manly, MD    This is dictation was done by dragon, computer voice recognition software.  Quite often unanticipated grammatical,  syntax, homophones and other interpretive errors or inadvertently transcribed by the computer software.  Please excuse errors that have escaped final proofreading.  Thank you.

## 2021-11-01 NOTE — Progress Notes (Signed)
Progress Note      11/01/2021 11:43 AM  NAME: Tyler Escobar   ZOX:[id number]   Admit Diagnosis: TIA (transient ischemic attack) [G45.9]  Suspected cerebrovascular accident (CVA) [R09.89]      Subjective:   Chart reviewed.  Echocardiogram results explained.  Patient feels much better.  Denies any weakness or chest pain or shortness of breath.  Patient experienced dizziness and his blood pressure was high.  Still blood pressure was about 180s.    Review of Systems:    Symptom Y/N Comments  Symptom Y/N Comments   Fever/Chills n   Chest Pain n    Poor Appetite    Edema     Cough    Abdominal Pain n    Sputum    Joint Pain     SOB/DOE n   Pruritis/Rash     Nausea/vomit    Other     Diarrhea         Constipation           Could NOT obtain due to:      Objective:          Physical Exam:    Last 24hrs VS reviewed since prior progress note. Most recent are:    BP (!) 159/94 Comment: notified nurse  Pulse 66   Temp 97.4 F (36.3 C) (Oral)   Resp 19   Ht 1.829 m (6')   Wt (!) 183.9 kg (405 lb 8 oz)   SpO2 98%   BMI 55.00 kg/m     Intake/Output Summary (Last 24 hours) at 11/01/2021 1143  Last data filed at 10/31/2021 2109  Gross per 24 hour   Intake 10 ml   Output --   Net 10 ml        General Appearance: Well developed, well nourished, alert & oriented x 3,    no acute distress.  Ears/Nose/Mouth/Throat: Hearing grossly normal.  Neck: Supple.  Chest: Lungs clear to auscultation bilaterally.  Cardiovascular: Regular rate and rhythm, S1,S2 normal, no murmur.  Abdomen: Soft, non-tender, bowel sounds are active.  Extremities: No edema bilaterally.  Skin: Warm and dry.    []          Post-cath site without hematoma, bruit, tenderness, or thrill.  Distal pulses intact.    PMH/SH reviewed - no change compared to H&P    Data Review    Telemetry: normal sinus rhythm     EKG:   []   No new EKG for review    Lab Data Personally Reviewed:    Recent Labs     10/29/21  1205 10/30/21  1303   WBC 5.6 5.3   HGB 12.8 12.7   HCT 38.5  38.9   PLT 213 226       No results for input(s): INR, PROTIME, APTT in the last 72 hours.   Recent Labs     10/29/21  1205 10/30/21  1303 11/01/21  0647   NA 132* 135* 138   K 4.1 4.1 4.0   CL 99 104 105   CO2 24 26 29    BUN 17 17 12    CREATININE 1.50* 1.59* 1.20   GLUCOSE 378* 330* 195*   CALCIUM 8.8 9.0 8.9   MG 1.7  --   --        No results for input(s): CKTOTAL, CKMB, TROPONINI, BNP, PROBNP, NTPROBNP in the last 72 hours.    Invalid input(s): CKINDEX  Lab Results   Component Value Date/Time  CHOL 209 10/30/2021 01:03 PM    TRIG 275 10/30/2021 01:03 PM    HDL 34 10/30/2021 01:03 PM    LDLCALC 120 10/30/2021 01:03 PM    LABVLDL 55 10/30/2021 01:03 PM    CHOLHDLRATIO 6.1 10/30/2021 01:03 PM       Recent Labs     10/29/21  1205   AST 21   ALKPHOS 80   LABALBU 3.3*   GLOB 4.0       No results for input(s): PH, PCO2, PO2 in the last 72 hours.    Medications Personally Reviewed:    Current Facility-Administered Medications   Medication Dose Route Frequency    insulin glargine (LANTUS) injection vial 24 Units  24 Units SubCUTAneous BID    lisinopril (PRINIVIL;ZESTRIL) tablet 10 mg  10 mg Oral Daily    hydrALAZINE (APRESOLINE) tablet 100 mg  100 mg Oral 3 times per day    aspirin EC tablet 81 mg  81 mg Oral Daily    glucose chewable tablet 16 g  4 tablet Oral PRN    dextrose bolus 10% 125 mL  125 mL IntraVENous PRN    Or    dextrose bolus 10% 250 mL  250 mL IntraVENous PRN    glucagon injection 1 mg  1 mg SubCUTAneous PRN    dextrose 10 % infusion   IntraVENous Continuous PRN    amLODIPine (NORVASC) tablet 10 mg  10 mg Oral Daily    carvedilol (COREG) tablet 25 mg  25 mg Oral BID WC    insulin lispro (HUMALOG) injection vial 0-8 Units  0-8 Units SubCUTAneous TID WC    insulin lispro (HUMALOG) injection vial 0-4 Units  0-4 Units SubCUTAneous Nightly    atorvastatin (LIPITOR) tablet 80 mg  80 mg Oral Nightly    sodium chloride flush 0.9 % injection 5-40 mL  5-40 mL IntraVENous 2 times per day    sodium chloride flush  0.9 % injection 5-40 mL  5-40 mL IntraVENous PRN    0.9 % sodium chloride infusion   IntraVENous PRN    enoxaparin (LOVENOX) injection 60 mg  60 mg SubCUTAneous BID    ondansetron (ZOFRAN-ODT) disintegrating tablet 4 mg  4 mg Oral Q8H PRN    Or    ondansetron (ZOFRAN) injection 4 mg  4 mg IntraVENous Q6H PRN    polyethylene glycol (GLYCOLAX) packet 17 g  17 g Oral Daily PRN    acetaminophen (TYLENOL) tablet 650 mg  650 mg Oral Q6H PRN    Or    acetaminophen (TYLENOL) suppository 650 mg  650 mg Rectal Q6H PRN           Problem List:   Symptoms of left-sided weakness has resolved.  Blood pressure is uncontrolled.  Diabetes mellitus.  Morbid exogenous obesity.  Obstructive sleep apnea.       Assessment/Plan:   I will adjust hypertensive medications.  Otherwise continue present care.  2.           []        High complexity decision making was performed in this patient at high risk for decompensation with multiple organ involvement.    , MD

## 2021-11-02 LAB — POCT GLUCOSE
POC Glucose: 186 mg/dL — ABNORMAL HIGH (ref 65–100)
POC Glucose: 191 mg/dL — ABNORMAL HIGH (ref 65–100)
POC Glucose: 222 mg/dL — ABNORMAL HIGH (ref 65–100)
POC Glucose: 332 mg/dL — ABNORMAL HIGH (ref 65–100)

## 2021-11-02 LAB — BASIC METABOLIC PANEL W/ REFLEX TO MG FOR LOW K
Anion Gap: 5 mmol/L (ref 5–15)
BUN: 16 mg/dL (ref 6–20)
Bun/Cre Ratio: 12 (ref 12–20)
CO2: 28 mmol/L (ref 21–32)
Calcium: 8.8 mg/dL (ref 8.5–10.1)
Chloride: 104 mmol/L (ref 97–108)
Creatinine: 1.35 mg/dL — ABNORMAL HIGH (ref 0.70–1.30)
Est, Glom Filt Rate: >60 mL/min/{1.73_m2} (ref >60)
Glucose: 240 mg/dL — ABNORMAL HIGH (ref 65–100)
Potassium: 3.8 mmol/L (ref 3.5–5.1)
Sodium: 137 mmol/L (ref 136–145)

## 2021-11-02 MED ORDER — HYDRALAZINE HCL 100 MG PO TABS
100 MG | ORAL_TABLET | Freq: Three times a day (TID) | ORAL | 3 refills | Status: AC
Start: 2021-11-02 — End: ?

## 2021-11-02 MED ORDER — EMPAGLIFLOZIN 10 MG PO TABS
10 MG | ORAL_TABLET | Freq: Every day | ORAL | 0 refills | Status: AC
Start: 2021-11-02 — End: ?

## 2021-11-02 MED ORDER — METFORMIN HCL 1000 MG PO TABS
1000 MG | ORAL_TABLET | Freq: Two times a day (BID) | ORAL | 3 refills | Status: AC
Start: 2021-11-02 — End: ?

## 2021-11-02 MED ORDER — ATORVASTATIN CALCIUM 80 MG PO TABS
80 MG | ORAL_TABLET | Freq: Every evening | ORAL | 3 refills | Status: AC
Start: 2021-11-02 — End: ?

## 2021-11-02 MED ORDER — LISINOPRIL 10 MG PO TABS
10 MG | ORAL_TABLET | Freq: Every day | ORAL | 3 refills | Status: AC
Start: 2021-11-02 — End: ?

## 2021-11-02 MED ORDER — CLONIDINE HCL 0.1 MG PO TABS
0.1 MG | ORAL_TABLET | Freq: Two times a day (BID) | ORAL | 3 refills | Status: AC
Start: 2021-11-02 — End: ?

## 2021-11-02 MED ORDER — ASPIRIN 81 MG PO TBEC
81 MG | ORAL_TABLET | Freq: Every day | ORAL | 3 refills | Status: AC
Start: 2021-11-02 — End: ?

## 2021-11-02 MED FILL — LANTUS 100 UNIT/ML SC SOLN: 100 UNIT/ML | SUBCUTANEOUS | Qty: 24

## 2021-11-02 MED FILL — HYDRALAZINE HCL 50 MG PO TABS: 50 MG | ORAL | Qty: 2

## 2021-11-02 MED FILL — HUMALOG 100 UNIT/ML IJ SOLN: 100 UNIT/ML | INTRAMUSCULAR | Qty: 5

## 2021-11-02 MED FILL — CARVEDILOL 12.5 MG PO TABS: 12.5 MG | ORAL | Qty: 2

## 2021-11-02 MED FILL — HUMALOG 100 UNIT/ML IJ SOLN: 100 UNIT/ML | INTRAMUSCULAR | Qty: 2

## 2021-11-02 MED FILL — LOVENOX 60 MG/0.6ML IJ SOSY: 60 MG/0.6ML | INTRAMUSCULAR | Qty: 0.6

## 2021-11-02 MED FILL — AMLODIPINE BESYLATE 5 MG PO TABS: 5 MG | ORAL | Qty: 2

## 2021-11-02 MED FILL — HUMALOG 100 UNIT/ML IJ SOLN: 100 UNIT/ML | INTRAMUSCULAR | Qty: 4

## 2021-11-02 MED FILL — ASPIRIN LOW DOSE 81 MG PO TBEC: 81 MG | ORAL | Qty: 1

## 2021-11-02 MED FILL — CLONIDINE HCL 0.1 MG PO TABS: 0.1 MG | ORAL | Qty: 1

## 2021-11-02 MED FILL — ATORVASTATIN CALCIUM 40 MG PO TABS: 40 MG | ORAL | Qty: 2

## 2021-11-02 MED FILL — LISINOPRIL 10 MG PO TABS: 10 MG | ORAL | Qty: 1

## 2021-11-02 NOTE — Care Coordination-Inpatient (Signed)
CM noted discharge order.  Patient has no CM needs at discharge.  Primary RN made aware.    Transition of Care Plan:    RUR: 6%  Prior Level of Functioning: Independent  Disposition: Home/self  If SNF or IPR: Date FOC offered: N/A  Date FOC received: N/A  Accepting facility: N/A  Date authorization started with reference number: N/A  Date authorization received and expires: N/A  Follow up appointments: Yes  DME needed: N/A  Transportation at discharge: Family/friends  IM/IMM Medicare/Tricare letter given: N/A  Is patient a Veteran and connected with VA? N/A   If yes, was [place] transfer form completed and VA notified?   Caregiver Contact: N/A  Discharge Caregiver contacted prior to discharge? N/A  Care Conference needed? No  Barriers to discharge: None

## 2021-11-02 NOTE — Discharge Summary (Signed)
Physician Discharge Summary     Patient ID:    Tyler Escobar  [id number]  41 y.o.  [birth year]    Admit date: 10/29/2021    Discharge date : 11/02/2021      Final Diagnoses:   TIA (transient ischemic attack) [G45.9]  Suspected cerebrovascular accident (CVA) [R09.89]  Accelerated hypertension [I10] stroke ruled out  Morbid obesity  Uncontrolled diabetes mellitus type 2  Medical noncompliance  Mild AKI    Reason for Hospitalization:  41 y.o. male with diabetes, hypertension presented to the ED with chief complaint of left face, arm and leg numbness. He was driving this morning, and at about 9:15am he started noticing fluttering of his chest followed by numbness of the left side of his body. Also associated with left leg and arm weakness. He did not notice facial droop, slurred speech, vertigo or vision loss.  He presented to outside ED but unable to undergo CT scan there due to body habitus. He is then transferred to our facility. By th time he arrived to our facility, most of his symptoms resolved and only have some residue numbness of his left shoulder. In addition, he also was told to have irregular rhythm before, but unable to specify if it is atrial fibrillation and was never prescribed AC.   In the ED, hypertensive. CTA showed right P3 segment stenosis but no large vessels occlusion. No carotid artery stenosis bilaterally. CT head no acute findings      Hospital Course:   Admitted to medical telemetry.  MRI was ordered but could not be done due to his large body habitus.  Repeat head CT was obtained which showed no evidence for stroke.    Echo with preserved EF and negative bubble study.  Carotid studies without hemodynamically significant stenosis I think he would also benefit from an outpatient cardiac monitor to rule out arrhythmia such as A-fib.  Lipid panel with total cholesterol 209 and LDL of 120 above target.  Started statin.  Baseline LFTs okay.    Was noted to have uncontrolled diabetes  and was started on Lantus and Humalog during this hospital stay.  Hemoglobin A1c was greater than 11.  Patient freely admits he has been extremely noncompliant with his diet.  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    Started on aspirin and high intensity statin    Patient did have mild AKI on admission which improved.    He was felt stable for discharge home on 6/20    Discharge Medications:      Medication List        ASK your doctor about these medications      amLODIPine 10 MG tablet  Commonly known as: NORVASC     carvedilol 25 MG tablet  Commonly known as: COREG     metFORMIN 1000 MG tablet  Commonly known as: GLUCOPHAGE                Follow up Care:    Follow-up Information    None          Diet:  diabetic diet    Disposition:  Home.    Advanced Directive:   FULL    DNR      Discharge Exam:  General:  Alert, cooperative, no distress, appears stated age.   Lungs:   Clear to auscultation bilaterally.  Chest wall:  No tenderness or deformity.   Heart:  Regular rate and rhythm, S1, S2 normal, no murmur, click, rub or gallop.   Abdomen:   Soft, non-tender. Bowel sounds normal. No masses,  No organomegaly.   Extremities: Extremities normal, atraumatic, no cyanosis or edema.   Pulses: 2+ and symmetric all extremities.   Skin: Skin color, texture, turgor normal. No rashes or lesions   Neurologic: CNII-XII intact. No gross sensory or motor deficits        CONSULTATIONS: cardiology    Significant Diagnostic Studies:   10/29/2021: BUN 17 mg/dL (Ref range: 6 - 20 mg/dL); Calcium 8.8 mg/dL (Ref range: 8.5 - 09.3 mg/dL); Chloride 99 mmol/L (Ref range: 97 - 108 mmol/L); CO2 24 mmol/L (Ref range: 21 - 32 mmol/L); Creatinine 1.50 mg/dL (H; Ref range: 2.67 - 1.24 mg/dL); Glucose 378 mg/dL (H; Ref range: 65 - 580 mg/dL); Hematocrit 38.5 % (Ref range: 36.6 - 50.3 %); Hemoglobin  12.8 g/dL (Ref range: 99.8 - 33.8 g/dL); Potassium 4.1 mmol/L (Ref range: 3.5 - 5.1 mmol/L); Sodium 132 mmol/L (L; Ref range: 136 - 145 mmol/L)  10/30/2021: BUN 17 mg/dL (Ref range: 6 - 20 mg/dL); Calcium 9.0 mg/dL (Ref range: 8.5 - 25.0 mg/dL); Chloride 104 mmol/L (Ref range: 97 - 108 mmol/L); CO2 26 mmol/L (Ref range: 21 - 32 mmol/L); Creatinine 1.59 mg/dL (H; Ref range: 5.39 - 7.67 mg/dL); Glucose 330 mg/dL (H; Ref range: 65 - 341 mg/dL); Hematocrit 38.9 % (Ref range: 36.6 - 50.3 %); Hemoglobin 12.7 g/dL (Ref range: 93.7 - 90.2 g/dL); Potassium 4.1 mmol/L (Ref range: 3.5 - 5.1 mmol/L); Sodium 135 mmol/L (L; Ref range: 136 - 145 mmol/L)  Recent Labs     10/30/21  1303   WBC 5.3   HGB 12.7   HCT 38.9   PLT 226     Recent Labs     10/30/21  1303 11/01/21  0647 11/02/21  0634   NA 135* 138 137   K 4.1 4.0 3.8   CL 104 105 104   CO2 26 29 28    BUN 17 12 16    CREATININE 1.59* 1.20 1.35*   GLUCOSE 330* 195* 240*   CALCIUM 9.0 8.9 8.8     No results for input(s): AST, ALT, ALKPHOS, BILITOT, PROT, LABALBU, GLOB, GGT, AMYLASE, LIPASE in the last 72 hours.    Invalid input(s): GPT  No results for input(s): INR, PROTIME, APTT in the last 72 hours.   No results for input(s): IRON, TIBC, FERR in the last 72 hours.    Invalid input(s): PSAT   No results for input(s): PH, PCO2, PO2 in the last 72 hours.  No results for input(s): CKTOTAL, CKMB, TROPONINI in the last 72 hours.  Lab Results   Component Value Date/Time    POCGLU 191 11/02/2021 08:15 AM    POCGLU 332 11/01/2021 07:57 PM    POCGLU 237 11/01/2021 03:50 PM    POCGLU 228 11/01/2021 11:55 AM    POCGLU 240 11/01/2021 08:25 AM         Discharge time spent 35 minutes    Signed:  11/03/2021 RFrelier, MD  11/02/2021  10:45 AM

## 2021-11-02 NOTE — Progress Notes (Signed)
Progress Note      11/02/2021 10:08 AM  NAME: Tyler Escobar   ZOX:[id number]   Admit Diagnosis: TIA (transient ischemic attack) [G45.9]  Suspected cerebrovascular accident (CVA) [R09.89]  Accelerated hypertension [I10]      Subjective:   Chart reviewed.  Echocardiogram results explained.  Patient feels much better.  Denies any weakness or chest pain or shortness of breath.  Patient experienced dizziness and his blood pressure was high.  Still blood pressure was about 150s.    Review of Systems:    Symptom Y/N Comments  Symptom Y/N Comments   Fever/Chills n   Chest Pain n    Poor Appetite    Edema     Cough    Abdominal Pain n    Sputum    Joint Pain     SOB/DOE n   Pruritis/Rash     Nausea/vomit    Other     Diarrhea         Constipation           Could NOT obtain due to:      Objective:          Physical Exam:    Last 24hrs VS reviewed since prior progress note. Most recent are:    BP (!) 153/90   Pulse 65   Temp 97.5 F (36.4 C) (Oral)   Resp 18   Ht 1.829 m (6')   Wt (!) 183.9 kg (405 lb 8 oz)   SpO2 100%   BMI 55.00 kg/m   No intake or output data in the 24 hours ending 11/02/21 1008       General Appearance: Well developed, well nourished, alert & oriented x 3,    no acute distress.  Ears/Nose/Mouth/Throat: Hearing grossly normal.  Neck: Supple.  Chest: Lungs clear to auscultation bilaterally.  Cardiovascular: Regular rate and rhythm, S1,S2 normal, no murmur.  Abdomen: Soft, non-tender, bowel sounds are active.  Extremities: No edema bilaterally.  Skin: Warm and dry.    []          Post-cath site without hematoma, bruit, tenderness, or thrill.  Distal pulses intact.    PMH/SH reviewed - no change compared to H&P    Data Review    Telemetry: normal sinus rhythm     EKG:   []   No new EKG for review    Lab Data Personally Reviewed:    Recent Labs     10/30/21  1303   WBC 5.3   HGB 12.7   HCT 38.9   PLT 226       No results for input(s): INR, PROTIME, APTT in the last 72 hours.   Recent Labs      10/30/21  1303 11/01/21  0647 11/02/21  0634   NA 135* 138 137   K 4.1 4.0 3.8   CL 104 105 104   CO2 26 29 28    BUN 17 12 16    CREATININE 1.59* 1.20 1.35*   GLUCOSE 330* 195* 240*   CALCIUM 9.0 8.9 8.8       No results for input(s): CKTOTAL, CKMB, TROPONINI, BNP, PROBNP, NTPROBNP in the last 72 hours.    Invalid input(s): CKINDEX  Lab Results   Component Value Date/Time    CHOL 209 10/30/2021 01:03 PM    TRIG 275 10/30/2021 01:03 PM    HDL 34 10/30/2021 01:03 PM    LDLCALC 120 10/30/2021 01:03 PM    LABVLDL 55 10/30/2021 01:03 PM  CHOLHDLRATIO 6.1 10/30/2021 01:03 PM       No results for input(s): AST, ALKPHOS, LABPROT, LABALBU, GLOB, GGT, AMYLASE, LIPASE in the last 72 hours.    Invalid input(s): GPT, TBILITOT    No results for input(s): PH, PCO2, PO2 in the last 72 hours.    Medications Personally Reviewed:    Current Facility-Administered Medications   Medication Dose Route Frequency    insulin glargine (LANTUS) injection vial 24 Units  24 Units SubCUTAneous BID    lisinopril (PRINIVIL;ZESTRIL) tablet 10 mg  10 mg Oral Daily    cloNIDine (CATAPRES) tablet 0.1 mg  0.1 mg Oral BID    insulin lispro (HUMALOG) injection vial 5 Units  5 Units SubCUTAneous TID WC    hydrALAZINE (APRESOLINE) tablet 100 mg  100 mg Oral 3 times per day    aspirin EC tablet 81 mg  81 mg Oral Daily    glucose chewable tablet 16 g  4 tablet Oral PRN    dextrose bolus 10% 125 mL  125 mL IntraVENous PRN    Or    dextrose bolus 10% 250 mL  250 mL IntraVENous PRN    glucagon injection 1 mg  1 mg SubCUTAneous PRN    dextrose 10 % infusion   IntraVENous Continuous PRN    amLODIPine (NORVASC) tablet 10 mg  10 mg Oral Daily    carvedilol (COREG) tablet 25 mg  25 mg Oral BID WC    insulin lispro (HUMALOG) injection vial 0-8 Units  0-8 Units SubCUTAneous TID WC    insulin lispro (HUMALOG) injection vial 0-4 Units  0-4 Units SubCUTAneous Nightly    atorvastatin (LIPITOR) tablet 80 mg  80 mg Oral Nightly    sodium chloride flush 0.9 % injection 5-40  mL  5-40 mL IntraVENous 2 times per day    sodium chloride flush 0.9 % injection 5-40 mL  5-40 mL IntraVENous PRN    0.9 % sodium chloride infusion   IntraVENous PRN    enoxaparin (LOVENOX) injection 60 mg  60 mg SubCUTAneous BID    ondansetron (ZOFRAN-ODT) disintegrating tablet 4 mg  4 mg Oral Q8H PRN    Or    ondansetron (ZOFRAN) injection 4 mg  4 mg IntraVENous Q6H PRN    polyethylene glycol (GLYCOLAX) packet 17 g  17 g Oral Daily PRN    acetaminophen (TYLENOL) tablet 650 mg  650 mg Oral Q6H PRN    Or    acetaminophen (TYLENOL) suppository 650 mg  650 mg Rectal Q6H PRN           Problem List:   Symptoms of left-sided weakness has resolved.  Blood pressure is improving but still high.  Diabetes mellitus.  Morbid exogenous obesity.  Obstructive sleep apnea.       Assessment/Plan:   I will adjust hypertensive medications.  Okay to discharge home and will be happy to follow as an outpatient.  Otherwise continue present care.  2.           []        High complexity decision making was performed in this patient at high risk for decompensation with multiple organ involvement.    , MD

## 2021-11-02 NOTE — Progress Notes (Signed)
Spiritual Care Assessment/Progress Note  [facility]    Name: Caison Hearn MRN: [id number]    Age: 41 y.o.     Sex: male   Language: English     Date: 11/02/2021            Total Time Calculated: 34 min              Spiritual Assessment begun in [facility]  Service Provided For:: Patient  Referral/Consult From:: Rounding  Encounter Overview/Reason : Initial Encounter    Spiritual beliefs:      [x]  Involved in a faith tradition/spiritual practice: Baptist     [x]  Supported by a faith community: Street     []  Claims no spiritual orientation:      []  Seeking spiritual identity:           []  Adheres to an individual form of spirituality:      []  Not able to assess:                Identified resources for coping and support system:   Support System: Family members, [employer], Congregation/faith community       [x]  Prayer                  [x]  Devotional reading               []  Music                  []  Guided Imagery     []  Pet visits                                        []  Other: [employer], Driving     Specific area/focus of visit   Encounter: Type: Initial Screen/Assessment  Crisis:    Spiritual/Emotional needs: Type: Spiritual Support  Ritual, Rites and Sacraments:    Grief, Loss, and Adjustments:    Ethics/Mediation:    Behavioral Health:    Palliative Care:    Advance Care Planning:      Plan/Referrals: Other (Comment) (Chaplain is available if needed)    Narrative: Chaplain initiated Spiritual Care visit with Mr. Stillings while rounding on 8 for the purpose of completing a spiritual assessment. Mr. Spira was engaged in conversation with CNA when Chaplain arrived. Mr. Urbanek is warm and welcoming. Pt shared details of recent medical episode. 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 Mr. Harts is a very pleasant gentleman. 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 Mr. 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 Mr. Szabo expressed his appreciation for the visit. Chaplain provided supportive presence and prayer.     Please contact Spiritual Care for any spiritual needs and/or further referrals.    Rev. Ameira Alessandrini, MDIV  Chaplain can be reached by calling the operator at [facility]  [phone]

## 2022-05-20 ENCOUNTER — Emergency Department (HOSPITAL_COMMUNITY): Payer: BLUE CROSS/BLUE SHIELD

## 2022-05-20 ENCOUNTER — Emergency Department (HOSPITAL_COMMUNITY)
Admission: EM | Admit: 2022-05-20 | Discharge: 2022-05-20 | Disposition: A | Discharge status: Home / Self Care | Payer: BLUE CROSS/BLUE SHIELD | Attending: Emergency Medicine | Admitting: Emergency Medicine

## 2022-05-20 ENCOUNTER — Other Ambulatory Visit: Payer: Self-pay

## 2022-05-20 ENCOUNTER — Encounter (HOSPITAL_COMMUNITY): Payer: Self-pay

## 2022-05-20 DIAGNOSIS — Z1152 Encounter for screening for COVID-19: Secondary | ICD-10-CM | POA: Diagnosis not present

## 2022-05-20 DIAGNOSIS — Z7982 Long term (current) use of aspirin: Secondary | ICD-10-CM | POA: Diagnosis not present

## 2022-05-20 DIAGNOSIS — I10 Essential (primary) hypertension: Secondary | ICD-10-CM | POA: Diagnosis not present

## 2022-05-20 DIAGNOSIS — R7989 Other specified abnormal findings of blood chemistry: Secondary | ICD-10-CM | POA: Insufficient documentation

## 2022-05-20 DIAGNOSIS — R059 Cough, unspecified: Secondary | ICD-10-CM | POA: Diagnosis present

## 2022-05-20 DIAGNOSIS — Z79899 Other long term (current) drug therapy: Secondary | ICD-10-CM | POA: Diagnosis not present

## 2022-05-20 LAB — TROPONIN I (HIGH SENSITIVITY)
Troponin I (High Sensitivity): 24 ng/L — ABNORMAL HIGH (ref <18)
Troponin I (High Sensitivity): 25 ng/L — ABNORMAL HIGH (ref <18)

## 2022-05-20 LAB — RESP PANEL BY RT-PCR (RSV, FLU A&B, COVID)  RVPGX2
Influenza A by PCR: NEGATIVE
Influenza B by PCR: NEGATIVE
Resp Syncytial Virus by PCR: NEGATIVE
SARS Coronavirus 2 by RT PCR: NEGATIVE

## 2022-05-20 LAB — CBC WITH DIFFERENTIAL/PLATELET
Abs Immature Granulocytes: 0.01 10*3/uL (ref 0.00–0.07)
Basophils Absolute: 0 10*3/uL (ref 0.0–0.1)
Basophils Relative: 0 %
Eosinophils Absolute: 0.1 10*3/uL (ref 0.0–0.5)
Eosinophils Relative: 1 %
HCT: 37.3 % — ABNORMAL LOW (ref 39.0–52.0)
Hemoglobin: 12.5 g/dL — ABNORMAL LOW (ref 13.0–17.0)
Immature Granulocytes: 0 %
Lymphocytes Relative: 29 %
Lymphs Abs: 2.4 10*3/uL (ref 0.7–4.0)
MCH: 30.8 pg (ref 26.0–34.0)
MCHC: 33.5 g/dL (ref 30.0–36.0)
MCV: 91.9 fL (ref 80.0–100.0)
Monocytes Absolute: 0.6 10*3/uL (ref 0.1–1.0)
Monocytes Relative: 8 %
Neutro Abs: 5 10*3/uL (ref 1.7–7.7)
Neutrophils Relative %: 62 %
Platelets: 242 10*3/uL (ref 150–400)
RBC: 4.06 MIL/uL — ABNORMAL LOW (ref 4.22–5.81)
RDW: 11.8 % (ref 11.5–15.5)
WBC: 8.1 10*3/uL (ref 4.0–10.5)
nRBC: 0 % (ref 0.0–0.2)

## 2022-05-20 LAB — BASIC METABOLIC PANEL
Anion gap: 7 (ref 5–15)
BUN: 17 mg/dL (ref 6–20)
CO2: 25 mmol/L (ref 22–32)
Calcium: 8.4 mg/dL — ABNORMAL LOW (ref 8.9–10.3)
Chloride: 103 mmol/L (ref 98–111)
Creatinine, Ser: 1.29 mg/dL — ABNORMAL HIGH (ref 0.61–1.24)
GFR, Estimated: >60 mL/min (ref >60)
Glucose, Bld: 129 mg/dL — ABNORMAL HIGH (ref 70–99)
Potassium: 3.8 mmol/L (ref 3.5–5.1)
Sodium: 135 mmol/L (ref 135–145)

## 2022-05-20 MED ORDER — ALBUTEROL SULFATE HFA 108 (90 BASE) MCG/ACT IN AERS: 2.0000 | INHALATION_SPRAY | RESPIRATORY_TRACT | 2 refills | Status: AC | PRN

## 2022-05-20 MED ORDER — HYDRALAZINE HCL 25 MG PO TABS
100.0000 mg | ORAL_TABLET | Freq: Once | ORAL | Status: AC
  Administered 2022-05-20: 100 mg via ORAL
  Filled 2022-05-20 (×2): qty 4

## 2022-05-20 MED ORDER — BENZONATATE 200 MG PO CAPS: 200.0000 mg | ORAL_CAPSULE | Freq: Three times a day (TID) | ORAL | 0 refills | Status: AC | PRN

## 2022-05-20 MED ORDER — HYDRALAZINE HCL 20 MG/ML IJ SOLN
10.0000 mg | Freq: Once | INTRAMUSCULAR | Status: AC
  Administered 2022-05-20: 10 mg via INTRAVENOUS
  Filled 2022-05-20 (×2): qty 1

## 2022-05-20 NOTE — ED Triage Notes (Signed)
Pt presents with hypertension and cough. Cough started a few days ago and now has chest wall pain. Denies body aches, or known fevers. Pt has history of hypertension and is on BP meds and has been taking them.

## 2022-05-20 NOTE — ED Notes (Addendum)
Dr. Betsey Holiday aware pt's BP still elevated SBP> 200s, pt was administer PO and IV hydralazine, pt instructed to eat heart health diet and follow-up with PCP and cardiology regarding BP and return from new or worsening s/s, pt verbalized understanding. Pt verbalized will call [facility] this morning when they open at 8pm for next available appt.

## 2022-05-20 NOTE — ED Notes (Signed)
Verbal order to hold BP meds at this time per Dr. Betsey Holiday, pt's last 3 BP has had SBP 130-145 with HR 70-80s.

## 2022-05-20 NOTE — Discharge Instructions (Signed)
Check your blood pressure twice a day until you follow-up.  If you can get follow-up with your primary doctor before the cardiologist, do so.  If you develop chest pain, shortness of breath or once again developed very high blood pressures, return to the ED.

## 2022-05-20 NOTE — ED Provider Notes (Signed)
[facility] EMERGENCY DEPARTMENT Provider Note   CSN: [id number] Arrival date & time: 05/20/22  0001     History  Chief Complaint  Patient presents with   Hypertension   Cough    Frank Olsen is a 42 y.o. male.  Patient presents to the emergency department for evaluation of persistent cough.  Patient reports that the cough started just after Christmas and has been persistent.  He is now experiencing pain in the ribs when he coughs.  No shortness of breath.  He reports his blood pressure has been elevated.  He has been taking some over-the-counter cough medicine but has not taken it in 2 days.  Has been taking his normal blood pressure meds.       Home Medications Prior to Admission medications   Medication Sig Start Date End Date Taking? Authorizing Provider  albuterol (VENTOLIN HFA) 108 (90 Base) MCG/ACT inhaler Inhale 2 puffs into the lungs every 4 (four) hours as needed for wheezing or shortness of breath. 05/20/22  Yes Shemaiah Round, Canary Brim, MD  benzonatate (TESSALON) 200 MG capsule Take 1 capsule (200 mg total) by mouth 3 (three) times daily as needed for cough. 05/20/22  Yes Salaya Holtrop, Canary Brim, MD  Apple Cid Vn-Grn Tea-Bit Or-Cr (APPLE CIDER VINEGAR PLUS) TABS Take 1 tablet by mouth 3 (three) times daily.    [provider]  aspirin EC 325 MG tablet Take 1 tablet (325 mg total) by mouth 2 (two) times daily after a meal. Take x 1 month post op to decrease risk of blood clots. 03/09/16   Marshia Ly, PA-C  lisinopril-hydrochlorothiazide (PRINZIDE,ZESTORETIC) 20-25 MG tablet Take 1 tablet by mouth daily.    [provider]  metFORMIN (GLUCOPHAGE) 1000 MG tablet Take 1,000 mg by mouth 2 (two) times daily with a meal.    [provider]  methocarbamol (ROBAXIN-750) 750 MG tablet Take 1 tablet (750 mg total) by mouth every 8 (eight) hours as needed for muscle spasms. 03/09/16   Marshia Ly, PA-C  oxyCODONE-acetaminophen (PERCOCET/ROXICET) 5-325 MG  tablet Take 1-2 tablets by mouth every 4 (four) hours as needed for severe pain. 03/09/16   Marshia Ly, PA-C      Allergies    Patient has no known allergies.    Review of Systems   Review of Systems  Physical Exam Updated Vital Signs BP (!) 200/111 (BP Location: Right Arm)   Pulse 79   Temp 97.6 F (36.4 C) (Oral)   Resp 17   Ht 6' (1.829 m)   Wt (!) 181.9 kg   SpO2 100%   BMI 54.39 kg/m  Physical Exam Vitals and nursing note reviewed.  Constitutional:      General: He is not in acute distress.    Appearance: He is well-developed.  HENT:     Head: Normocephalic and atraumatic.     Mouth/Throat:     Mouth: Mucous membranes are moist.  Eyes:     General: Vision grossly intact. Gaze aligned appropriately.     Extraocular Movements: Extraocular movements intact.     Conjunctiva/sclera: Conjunctivae normal.  Cardiovascular:     Rate and Rhythm: Normal rate and regular rhythm.     Pulses: Normal pulses.     Heart sounds: Normal heart sounds, S1 normal and S2 normal. No murmur heard.    No friction rub. No gallop.  Pulmonary:     Effort: Pulmonary effort is normal. No respiratory distress.     Breath sounds: Normal breath sounds.  Abdominal:  Palpations: Abdomen is soft.     Tenderness: There is no abdominal tenderness. There is no guarding or rebound.     Hernia: No hernia is present.  Musculoskeletal:        General: No swelling.     Cervical back: Full passive range of motion without pain, normal range of motion and neck supple. No pain with movement, spinous process tenderness or muscular tenderness. Normal range of motion.     Right lower leg: No edema.     Left lower leg: No edema.  Skin:    General: Skin is warm and dry.     Capillary Refill: Capillary refill takes less than 2 seconds.     Findings: No ecchymosis, erythema, lesion or wound.  Neurological:     Mental Status: He is alert and oriented to person, place, and time.     GCS: GCS eye subscore  is 4. GCS verbal subscore is 5. GCS motor subscore is 6.     Cranial Nerves: Cranial nerves 2-12 are intact.     Sensory: Sensation is intact.     Motor: Motor function is intact. No weakness or abnormal muscle tone.     Coordination: Coordination is intact.  Psychiatric:        Mood and Affect: Mood normal.        Speech: Speech normal.        Behavior: Behavior normal.     ED Results / Procedures / Treatments   Labs (all labs ordered are listed, but only abnormal results are displayed) Labs Reviewed  CBC WITH DIFFERENTIAL/PLATELET - Abnormal; Notable for the following components:      Result Value   RBC 4.06 (*)    Hemoglobin 12.5 (*)    HCT 37.3 (*)    All other components within normal limits  BASIC METABOLIC PANEL - Abnormal; Notable for the following components:   Glucose, Bld 129 (*)    Creatinine, Ser 1.29 (*)    Calcium 8.4 (*)    All other components within normal limits  TROPONIN I (HIGH SENSITIVITY) - Abnormal; Notable for the following components:   Troponin I (High Sensitivity) 24 (*)    All other components within normal limits  TROPONIN I (HIGH SENSITIVITY) - Abnormal; Notable for the following components:   Troponin I (High Sensitivity) 25 (*)    All other components within normal limits  RESP PANEL BY RT-PCR (RSV, FLU A&B, COVID)  [id number]    EKG EKG Interpretation  Date/Time:  Friday May 20 2022 02:07:18 EST Ventricular Rate:  75 PR Interval:  159 QRS Duration: 94 QT Interval:  417 QTC Calculation: 466 R Axis:   60 Text Interpretation: Sinus rhythm Normal ECG Confirmed by Orpah Greek [phone]) on 05/20/2022 3:09:46 AM  Radiology DG Chest 2 View  Result Date: 05/20/2022 CLINICAL DATA:  Cough and chest wall pain. EXAM: CHEST - 2 VIEW COMPARISON:  None Available. FINDINGS: The heart size and mediastinal contours are within normal limits. Both lungs are clear. The visualized skeletal structures are unremarkable. IMPRESSION: No active  cardiopulmonary disease. Electronically Signed   By: Virgina Norfolk M.D.   On: 05/20/2022 02:10    Procedures Procedures    Medications Ordered in ED Medications  hydrALAZINE (APRESOLINE) injection 10 mg (10 mg Intravenous Given 05/20/22 0455)  hydrALAZINE (APRESOLINE) tablet 100 mg (100 mg Oral Given 05/20/22 0455)    ED Course/ Medical Decision Making/ A&P  Medical Decision Making Amount and/or Complexity of Data Reviewed Labs: ordered. Radiology: ordered.  Risk Prescription drug management.   Patient presents to the department for evaluation of cough.  He reports that the cough has been ongoing for more than a week.  He has noticed that his blood pressure is elevated.  He does have a history of refractory hypertension, on multiple medications.  He reports that he has been taking his meds.  Patient is not experiencing chest pain.  Workup was initiated because of his elevated blood pressures.  These actually did come down on their own prior to administering any antihypertensive here in the emergency department.  Patient's EKG is unremarkable.  First troponin slightly elevated at 24.  Second troponin essentially unchanged at 25.  I suspect that this is secondary to his elevated blood pressures as he has not had any chest pain or EKG changes.  Pressure is significantly improved.  Addendum: Patient's blood pressure did go back.  He was treated with additional hydralazine and had significant improvement again.  He is still asymptomatic.  No chest pain or shortness of breath.  Patient will be discharged with a referral to cardiology.  He is counseled to return to the ED for any chest pain or shortness of breath.  He is also to check his blood pressures frequently and if they are significantly elevated, return for further evaluation.        Final Clinical Impression(s) / ED Diagnoses Final diagnoses:  Hypertension, unspecified type  Elevated troponin    Rx /  DC Orders ED Discharge Orders          Ordered    Ambulatory referral to Cardiology       Comments: If you have not heard from the Cardiology office within the next 72 hours please call [phone].   05/20/22 0603    albuterol (VENTOLIN HFA) 108 (90 Base) MCG/ACT inhaler  Every 4 hours PRN        05/20/22 0604    benzonatate (TESSALON) 200 MG capsule  3 times daily PRN        05/20/22 0604              Orpah Greek, MD 05/20/22 [phone]

## 2022-06-30 ENCOUNTER — Ambulatory Visit: Payer: BLUE CROSS/BLUE SHIELD | Attending: Cardiology | Admitting: Cardiology

## 2022-06-30 NOTE — Progress Notes (Deleted)
Clinical Summary Frank Olsen is a 42 y.o.male  1.HTN -ER visit 05/20/22 with cough. BP's noted to be elevated 200/111 - mild flat trop 24-->25. CXR no acute process. EKG SR no specific ischemic changes     Past Medical History:  Diagnosis Date   Diabetes mellitus without complication (HCC)    Hypertension      No Known Allergies   Current Outpatient Medications  Medication Sig Dispense Refill   albuterol (VENTOLIN HFA) 108 (90 Base) MCG/ACT inhaler Inhale 2 puffs into the lungs every 4 (four) hours as needed for wheezing or shortness of breath. 1 each 2   Apple Cid Vn-Grn Tea-Bit Or-Cr (APPLE CIDER VINEGAR PLUS) TABS Take 1 tablet by mouth 3 (three) times daily.     aspirin EC 325 MG tablet Take 1 tablet (325 mg total) by mouth 2 (two) times daily after a meal. Take x 1 month post op to decrease risk of blood clots. 60 tablet 0   benzonatate (TESSALON) 200 MG capsule Take 1 capsule (200 mg total) by mouth 3 (three) times daily as needed for cough. 20 capsule 0   lisinopril-hydrochlorothiazide (PRINZIDE,ZESTORETIC) 20-25 MG tablet Take 1 tablet by mouth daily.     metFORMIN (GLUCOPHAGE) 1000 MG tablet Take 1,000 mg by mouth 2 (two) times daily with a meal.     methocarbamol (ROBAXIN-750) 750 MG tablet Take 1 tablet (750 mg total) by mouth every 8 (eight) hours as needed for muscle spasms. 60 tablet 0   oxyCODONE-acetaminophen (PERCOCET/ROXICET) 5-325 MG tablet Take 1-2 tablets by mouth every 4 (four) hours as needed for severe pain. 60 tablet 0   No current facility-administered medications for this visit.     Past Surgical History:  Procedure Laterality Date   QUADRICEPS TENDON REPAIR Bilateral 03/09/2016   Procedure: REPAIR LEFT QUADRICEP TENDON RUPTURE & REPAIR RIGHT QUADRICEP RUPTURE OR PATELLA TENDON RUPTURE.;  Surgeon: Dorna Leitz, MD;  Location: [facility];  Service: Orthopedics;  Laterality: Bilateral;   TONSILLECTOMY       No Known Allergies    Family History   Problem Relation Age of Onset   Diabetes Mellitus II Mother    Diabetes Mellitus II Father      Social History Frank Olsen reports that he has never smoked. He has never used smokeless tobacco. Frank Olsen reports no history of alcohol use.   Review of Systems CONSTITUTIONAL: No weight loss, fever, chills, weakness or fatigue.  HEENT: Eyes: No visual loss, blurred vision, double vision or yellow sclerae.No hearing loss, sneezing, congestion, runny nose or sore throat.  SKIN: No rash or itching.  CARDIOVASCULAR:  RESPIRATORY: No shortness of breath, cough or sputum.  GASTROINTESTINAL: No anorexia, nausea, vomiting or diarrhea. No abdominal pain or blood.  GENITOURINARY: No burning on urination, no polyuria NEUROLOGICAL: No headache, dizziness, syncope, paralysis, ataxia, numbness or tingling in the extremities. No change in bowel or bladder control.  MUSCULOSKELETAL: No muscle, back pain, joint pain or stiffness.  LYMPHATICS: No enlarged nodes. No history of splenectomy.  PSYCHIATRIC: No history of depression or anxiety.  ENDOCRINOLOGIC: No reports of sweating, cold or heat intolerance. No polyuria or polydipsia.  Marland Kitchen   Physical Examination There were no vitals filed for this visit. There were no vitals filed for this visit.  Gen: resting comfortably, no acute distress HEENT: no scleral icterus, pupils equal round and reactive, no palptable cervical adenopathy,  CV Resp: Clear to auscultation bilaterally GI: abdomen is soft, non-tender, non-distended, normal bowel  sounds, no hepatosplenomegaly MSK: extremities are warm, no edema.  Skin: warm, no rash Neuro:  no focal deficits Psych: appropriate affect   Diagnostic Studies     Assessment and Plan        Arnoldo Lenis, M.D., F.A.C.C.

## 2022-07-05 ENCOUNTER — Encounter: Payer: Self-pay | Admitting: Cardiology
